# Patient Record
Sex: Female | Born: 1975 | Hispanic: No | Smoking: Never smoker
Health system: Southern US, Community
[De-identification: ages and names within clinical notes are randomized; demographics above are authoritative.]

## PROBLEM LIST (undated history)

## (undated) DIAGNOSIS — T7840XA Allergy, unspecified, initial encounter: Secondary | ICD-10-CM

## (undated) DIAGNOSIS — G43109 Migraine with aura, not intractable, without status migrainosus: Secondary | ICD-10-CM

## (undated) HISTORY — DX: Migraine with aura, not intractable, without status migrainosus: G43.109

## (undated) HISTORY — DX: Allergy, unspecified, initial encounter: T78.40XA

---

## 2007-12-19 ENCOUNTER — Encounter (INDEPENDENT_AMBULATORY_CARE_PROVIDER_SITE_OTHER): Payer: Self-pay | Admitting: Obstetrics and Gynecology

## 2007-12-19 ENCOUNTER — Ambulatory Visit (HOSPITAL_COMMUNITY): Admission: RE | Admit: 2007-12-19 | Discharge: 2007-12-19 | Payer: Self-pay | Admitting: Obstetrics and Gynecology

## 2008-11-22 ENCOUNTER — Inpatient Hospital Stay (HOSPITAL_COMMUNITY): Admission: AD | Admit: 2008-11-22 | Discharge: 2008-11-25 | Payer: Self-pay | Admitting: Obstetrics and Gynecology

## 2010-08-29 ENCOUNTER — Inpatient Hospital Stay (HOSPITAL_COMMUNITY): Admission: RE | Admit: 2010-08-29 | Discharge: 2010-09-01 | Payer: Self-pay | Admitting: Obstetrics and Gynecology

## 2011-01-06 LAB — CBC
HCT: 29.3 % — ABNORMAL LOW (ref 36.0–46.0)
HCT: 36.8 % (ref 36.0–46.0)
Hemoglobin: 10 g/dL — ABNORMAL LOW (ref 12.0–15.0)
MCH: 28.6 pg (ref 26.0–34.0)
MCV: 86.1 fL (ref 78.0–100.0)
MCV: 86.1 fL (ref 78.0–100.0)
Platelets: 249 10*3/uL (ref 150–400)
RBC: 4.27 MIL/uL (ref 3.87–5.11)
RDW: 13.4 % (ref 11.5–15.5)
RDW: 13.5 % (ref 11.5–15.5)
WBC: 11.3 10*3/uL — ABNORMAL HIGH (ref 4.0–10.5)
WBC: 9.4 10*3/uL (ref 4.0–10.5)

## 2011-01-06 LAB — SURGICAL PCR SCREEN: MRSA, PCR: NEGATIVE

## 2011-02-09 LAB — CBC
HCT: 30.2 % — ABNORMAL LOW (ref 36.0–46.0)
Hemoglobin: 11.6 g/dL — ABNORMAL LOW (ref 12.0–15.0)
MCHC: 33.4 g/dL (ref 30.0–36.0)
MCHC: 33.4 g/dL (ref 30.0–36.0)
MCV: 88.7 fL (ref 78.0–100.0)
MCV: 88.9 fL (ref 78.0–100.0)
Platelets: 199 10*3/uL (ref 150–400)
RBC: 3.92 MIL/uL (ref 3.87–5.11)
RDW: 13.4 % (ref 11.5–15.5)
RDW: 13.7 % (ref 11.5–15.5)

## 2011-02-09 LAB — RH IMMUNE GLOB WKUP(>/=20WKS)(NOT WOMEN'S HOSP)

## 2011-03-10 NOTE — Op Note (Signed)
NAMEERION, HERMANS NO.:  [id number]   MEDICAL RECORD NO.:  [id number]          PATIENT TYPE:  INP   LOCATION:  9148                          FACILITY:  [facility]   PHYSICIAN:  Lenoard Aden, M.D.DATE OF BIRTH:  Dec 08, 1975   DATE OF PROCEDURE:  11/22/2008  DATE OF DISCHARGE:                               OPERATIVE REPORT   PREOPERATIVE DIAGNOSES:  Postdates intrauterine pregnancy, nonreassuring  fetal heart rate tracing, active phase arrest, presumed large for  gestational age.   POSTOPERATIVE DIAGNOSES:  Postdates intrauterine pregnancy,  nonreassuring fetal heart rate tracing, active phase arrest, presumed  large for gestational age.   PROCEDURE:  Primary low segment transverse cesarean section.   SURGEON:  Lenoard Aden, MD   ANESTHESIA:  Epidural by Quillian Quince, MD   ESTIMATED BLOOD LOSS:  1000 mL.   COMPLICATIONS:  None.   DRAINS:  Foley.   COUNTS:  Correct.   The patient recovered in good condition.   FINDINGS:  Full-term living female, Apgars of 8 and 9, cord pH 7.27, 9  pounds 9 ounces, posterior placenta.  Normal tubes.  Normal ovaries.  Two-layer uterine closure.   BRIEF OPERATIVE NOTE:  After being apprised of risks of anesthesia,  infection, bleeding, injury to abdominal organs, need for repair, the  labor's immediate complications to include bowel and bladder injury, the  patient was brought to the operating where she was administered a dosing  of epidural anesthetic without complications, prepped and draped in the  usual sterile fashion.  Foley catheter was placed.  After achieving  adequate anesthesia, dilute Marcaine solution was placed.  Pfannenstiel  skin incision was made with the scalpel, carried down to the fascia  which was nicked in the midline and opened transversely using Mayo  scissors.  Rectus muscles dissected sharply in the midline.  Peritoneum  entered sharply.  Bladder blade was placed.  Visceral peritoneum  scored  sharply off the lower uterine segment.  Kerr hysterotomy incision was  made.  Atraumatic delivery of full-term living female as noted.  Apgars as  noted.  Placenta delivered manually intact.  Bulb suctioning done.  The  uterus exteriorized, curetted using a dry lap pack and closed in 2  running imbricating layers of 0 Monocryl suture.  Good hemostasis was  noted.  Bladder flap inspected and found to be hemostatic.  Irrigation  accomplished.  Peritoneum reapproximated using a 2-0 chromic in a  continuous running fashion.  Fascia closed using a 0 Monocryl in a  continuous running fashion.  Skin closed using staples.  The patient  tolerated the procedure well and transferred to recovery room in good  condition.      Lenoard Aden, M.D.  Electronically Signed     RJT/MEDQ  D:  11/23/2008  T:  11/24/2008  Job:  [id number]

## 2011-03-10 NOTE — Op Note (Signed)
NAMETENLEIGH, BYER    ACCOUNT NO.:  [id number]   MEDICAL RECORD NO.:  [id number]          PATIENT TYPE:  AMB   LOCATION:  SDC                           FACILITY:  [facility]   PHYSICIAN:  Guy Sandifer. Henderson Cloud, M.D. DATE OF BIRTH:  [birth year]   DATE OF PROCEDURE:  12/19/2007  DATE OF DISCHARGE:                               OPERATIVE REPORT   PREOPERATIVE DIAGNOSIS:  Spontaneous abortion.   POSTOPERATIVE DIAGNOSIS:  Spontaneous abortion.   PROCEDURE:  Dilatation and evacuation.   SURGEON:  Harold Hedge, MD   ANESTHESIA:  MAC.   SPECIMENS:  Products of conception to Pathology.   ESTIMATED BLOOD LOSS:  Minimal.   INDICATIONS AND CONSENT:  This patient is a 35 year old married female  G1, P0, who had viable pregnancy at approximately 9 weeks' estimated  gestational age.  She returns at approximately 14 weeks' estimated  gestational age with complaints of spotting.  Ultrasound reveals a 9-  week-size crown-rump length and no fetal heartbeat.  After options of  management are reviewed, the patient requests dilatation and evacuation.  Potential risks and complications are discussed preoperatively  including, but limited to infection, uterine perforation, organ damage,  bleeding and transfusion of blood.  All questions are answered and  consent is signed and on the chart.  I also discussed with the patient  possibly sending tissue for karyotype; the patient declined this at the  present time.   PROCEDURE:  The patient is taken to operating room where she is  identified, placed in dorsal supine position and given sedation.  She is  placed in the dorsal lithotomy position, where she is prepped, bladder  straight-catheterized and draped in a sterile fashion.  Uterus is 9-10  weeks in size.  A bivalve speculum is placed in the vagina and the  anterior cervical lip is injected with 1% Xylocaine plain and grasped  with a single-tooth tenaculum.  Paracervical block is placed at 2, 4,  5,  7, 8 and 10 o'clock positions with approximately 20 mL total plain of 1%  Xylocaine.  Cervix then is dilated to a 29 dilator.  A #9 curved suction  curette is placed in the uterine cavity and suction curettage is carried  out for obvious products of conception.  Twenty units of Pitocin are  added to a new liter of IV fluids; the patient also received intravenous  antibiotics.  Sharp curettage is carried out.  The uterine cavity is  also  explored gently with ring clamps.  After the cavity feels clean, good  hemostasis is noted.  The apparent placental tissue as well as a small  soft cranium is identified.  The cavity feels clean.  Good hemostasis is  noted.  Instruments are removed.  All counts are correct.  The patient  is awakened and taken to the recovery room in stable condition.      Guy Sandifer Henderson Cloud, M.D.  Electronically Signed     JET/MEDQ  D:  12/19/2007  T:  12/20/2007  Job:  [id number]

## 2011-03-10 NOTE — H&P (Signed)
Tanya Mccoy, Tanya Mccoy NO.:  [id number]   MEDICAL RECORD NO.:  [id number]          PATIENT TYPE:  INP   LOCATION:  9176                          FACILITY:  [facility]   PHYSICIAN:  Lenoard Aden, M.D.DATE OF BIRTH:  [birth year]   DATE OF ADMISSION:  11/22/2008  DATE OF DISCHARGE:                              HISTORY & PHYSICAL   CHIEF COMPLAINT:  Postdate.   This is a 35 year old Hispanic female, G2, P0, at 18 weeks' gestation  who presents for cervical ripening and induction.   She has no known drug allergies.   Medications are prenatal vitamins.   She is a nonsmoker, nondrinker.  She denies domestic or physical  violence.   History of one SAB in 2009.  History of wisdom teeth removal.  History  of D&C.   Family history of diabetes and stroke.   Prenatal course uncomplicated.   PHYSICAL EXAMINATION:  GENERAL:  She is a well-developed, well-nourished  female in no acute distress.  HEENT:  Normal.  LUNGS:  Clear.  HEART:  Regular rate and rhythm.  ABDOMEN:  Soft, gravid, nontender.  Estimated fetal weight 8-1/2 pounds.  Cervix is closed, 50%, vertex, and -2.  EXTREMITIES:  There are no cords.  NEUROLOGIC:  Nonfocal.  SKIN:  Intact.   IMPRESSION:  41 weeks OB, postdate for cervical ripening.   PLAN:  Cervidil was placed.  NST reactive.  Pitocin in a.m.  Anticipate  cautious attempts at vaginal delivery due to borderline telemetry.  The  patient and husband discussed increased risk of C-section.      Lenoard Aden, M.D.  Electronically Signed     RJT/MEDQ  D:  11/22/2008  T:  11/23/2008  Job:  [id number]

## 2011-03-13 NOTE — Discharge Summary (Signed)
Tanya Mccoy, TWIFORD NO.:  [id number]   MEDICAL RECORD NO.:  [id number]          PATIENT TYPE:  INP   LOCATION:  9148                          [facility]ACILITY:  [facility]   PHYSICIAN:  Lenoard Aden, M.D.DATE OF BIRTH:  [birth year]   DATE OF ADMISSION:  11/22/2008  DATE OF DISCHARGE:  11/25/2008                               DISCHARGE SUMMARY   The patient underwent uncomplicated primary C-section for active phase  arrest on November 23, 2008.   Postoperative course uncomplicated.  Tolerated diet well.  Discharge  teaching done.   DISCHARGE MEDICATIONS:  Tylox, prenatal vitamins, and iron.   Follow up in the office in 4-6 weeks.      Lenoard Aden, M.D.  Electronically Signed     RJT/MEDQ  D:  12/18/2008  T:  12/19/2008  Job:  [id number]

## 2011-07-20 LAB — CBC
HCT: 41.5
Hemoglobin: 14.4
MCHC: 34.6
MCV: 87.3
RBC: 4.76
WBC: 8

## 2011-07-20 LAB — RH IMMUNE GLOB WKUP(>/=20WKS)(NOT WOMEN'S HOSP)

## 2013-05-12 ENCOUNTER — Other Ambulatory Visit: Payer: Self-pay | Admitting: Physician Assistant

## 2013-09-19 ENCOUNTER — Encounter (INDEPENDENT_AMBULATORY_CARE_PROVIDER_SITE_OTHER): Payer: Self-pay

## 2013-09-19 ENCOUNTER — Ambulatory Visit (INDEPENDENT_AMBULATORY_CARE_PROVIDER_SITE_OTHER): Payer: 59 | Admitting: Family Medicine

## 2013-09-19 VITALS — BP 107/67 | HR 77 | Temp 98.6°F | Ht 63.0 in | Wt 161.6 lb

## 2013-09-19 DIAGNOSIS — J029 Acute pharyngitis, unspecified: Secondary | ICD-10-CM

## 2013-09-19 DIAGNOSIS — J329 Chronic sinusitis, unspecified: Secondary | ICD-10-CM

## 2013-09-19 LAB — POCT RAPID STREP A (OFFICE): Rapid Strep A Screen: NEGATIVE

## 2013-09-19 MED ORDER — AMOXICILLIN 500 MG PO CAPS: 500.0000 mg | ORAL_CAPSULE | Freq: Three times a day (TID) | ORAL | Status: DC

## 2013-09-19 MED ORDER — AZELASTINE HCL 0.1 % NA SOLN: NASAL | Status: DC

## 2013-09-19 NOTE — Patient Instructions (Signed)
Drink plenty of fluids Use a cool mist humidifier in her bedroom at nighttime Take medication as directed and use nose sprays as directed If you need anything for cough, use Mucinex maximum strength, blue and white in color, over-the-counter one twice daily with a large glass of water

## 2013-09-19 NOTE — Progress Notes (Signed)
Subjective:    Patient ID: Tanya Mccoy, female    DOB: 27-Mar-1976, 37 y.o.   MRN: [id number]  HPI Patient here today for sore throat, cough and congestion. The patient feels like most of the congestion is coming from her head and the drainage is mostly colored green. She has had some ear pain but denies fever.     There are no active problems to display for this patient.  Outpatient Encounter Prescriptions as of 09/19/2013  Medication Sig  . Fish Oil-Cholecalciferol (FISH OIL + D3) 1000-1000 MG-UNIT CAPS Take by mouth.  . Multiple Vitamin (MULTIVITAMIN WITH MINERALS) TABS tablet Take 1 tablet by mouth daily.    Review of Systems  Constitutional: Negative.  Negative for fever.  HENT: Positive for congestion and sore throat. Negative for postnasal drip.   Eyes: Negative.   Respiratory: Positive for cough (started saturday).   Cardiovascular: Negative.   Gastrointestinal: Negative.   Endocrine: Negative.   Genitourinary: Negative.   Musculoskeletal: Negative.   Skin: Negative.   Allergic/Immunologic: Negative.   Neurological: Positive for dizziness (little bit of dizziness) and headaches.  Hematological: Negative.   Psychiatric/Behavioral: Negative.        Objective:   Physical Exam  Nursing note and vitals reviewed. Constitutional: She appears well-developed and well-nourished. No distress.  HENT:  Head: Normocephalic and atraumatic.  Right Ear: External ear normal.  Left Ear: External ear normal.  Mouth/Throat: Oropharynx is clear and moist. No oropharyngeal exudate.  Nasal congestion bilaterally and a slightly red throat  Eyes: Conjunctivae and EOM are normal. Pupils are equal, round, and reactive to light. Right eye exhibits no discharge. Left eye exhibits no discharge. No scleral icterus.  Neck: Normal range of motion. Neck supple.  Cardiovascular: Normal rate and regular rhythm.   No murmur heard. Pulmonary/Chest: Effort normal. No respiratory  distress. She has no wheezes. She has no rales.  Dry cough  Musculoskeletal: Normal range of motion.  Lymphadenopathy:    She has no cervical adenopathy.  Neurological: She is alert.  Skin: Skin is warm and dry.  Psychiatric: She has a normal mood and affect. Her behavior is normal. Thought content normal.   BP 107/67  Pulse 77  Temp(Src) 98.6 F (37 C) (Oral)  Ht 5\' 3"  (1.6 m)  Wt 161 lb 9.6 oz (73.301 kg)  BMI 28.63 kg/m2  LMP 09/05/2013  Results for orders placed in visit on 09/19/13  POCT RAPID STREP A (OFFICE)      Result Value Range   Rapid Strep A Screen Negative  Negative          Assessment & Plan:   1. Sore throat   2. Rhinosinusitis    Orders Placed This Encounter  Procedures  . Strep A culture, throat  . POCT rapid strep A   Meds ordered this encounter  Medications  . Multiple Vitamin (MULTIVITAMIN WITH MINERALS) TABS tablet    Sig: Take 1 tablet by mouth daily.  . Fish Oil-Cholecalciferol (FISH OIL + D3) 1000-1000 MG-UNIT CAPS    Sig: Take by mouth.  Marland Kitchen amoxicillin (AMOXIL) 500 MG capsule    Sig: Take 1 capsule (500 mg total) by mouth 3 (three) times daily.    Dispense:  30 capsule    Refill:  0  . azelastine (ASTELIN) 137 MCG/SPRAY nasal spray    Sig: Use in each nostril as directed, one to 2 sprays nightly    Dispense:  30 mL    Refill:  12  Patient Instructions  Drink plenty of fluids Use a cool mist humidifier in her bedroom at nighttime Take medication as directed and use nose sprays as directed If you need anything for cough, use Mucinex maximum strength, blue and white in color, over-the-counter one twice daily with a large glass of water   Nyra Capes MD

## 2013-09-21 LAB — STREP A CULTURE, THROAT: Strep A Culture: NEGATIVE

## 2013-09-26 ENCOUNTER — Telehealth: Payer: Self-pay | Admitting: Family Medicine

## 2013-09-26 NOTE — Telephone Encounter (Signed)
Message copied by Azalee Course on Tue Sep 26, 2013  4:36 PM ------      Message from: Ernestina Penna      Created: Fri Sep 22, 2013  9:25 AM       The strep culture is negative, if taking antibiotic please complete the antibiotic ------

## 2013-09-26 NOTE — Telephone Encounter (Signed)
Cant get in touch with patient all numbers wrong

## 2013-12-14 ENCOUNTER — Encounter: Payer: Self-pay | Admitting: *Deleted

## 2013-12-14 ENCOUNTER — Ambulatory Visit (INDEPENDENT_AMBULATORY_CARE_PROVIDER_SITE_OTHER): Payer: 59 | Admitting: Family Medicine

## 2013-12-14 VITALS — BP 107/65 | HR 70 | Temp 97.0°F | Ht 63.0 in | Wt 167.0 lb

## 2013-12-14 DIAGNOSIS — R509 Fever, unspecified: Secondary | ICD-10-CM

## 2013-12-14 DIAGNOSIS — R52 Pain, unspecified: Secondary | ICD-10-CM

## 2013-12-14 DIAGNOSIS — R059 Cough, unspecified: Secondary | ICD-10-CM

## 2013-12-14 DIAGNOSIS — R6889 Other general symptoms and signs: Secondary | ICD-10-CM

## 2013-12-14 DIAGNOSIS — J029 Acute pharyngitis, unspecified: Secondary | ICD-10-CM

## 2013-12-14 DIAGNOSIS — R05 Cough: Secondary | ICD-10-CM

## 2013-12-14 LAB — POCT INFLUENZA A/B
INFLUENZA A, POC: NEGATIVE
INFLUENZA B, POC: NEGATIVE

## 2013-12-14 LAB — POCT RAPID STREP A (OFFICE): RAPID STREP A SCREEN: NEGATIVE

## 2013-12-14 MED ORDER — OSELTAMIVIR PHOSPHATE 75 MG PO CAPS: 75.0000 mg | ORAL_CAPSULE | Freq: Two times a day (BID) | ORAL | Status: DC

## 2013-12-14 NOTE — Patient Instructions (Signed)
Take Tylenol or Advil for aches pains and fever Drink plenty of fluids Take Mucinex maximum strength, blue and white in color, over-the-counter, one twice daily with a large glass of water for cough and congestion Use saline nose spray frequently Use a cool mist humidifier in her bedroom at nighttime Take medication as directed

## 2013-12-14 NOTE — Progress Notes (Signed)
Subjective:    Patient ID: Tanya Mccoy, female    DOB: 18-Dec-1975, 38 y.o.   MRN: [id number]  HPI Patient here today for flu-like symptoms x 3 days. The patient has had headache, congestion, weakness, and fever with sore throat or 3 days. The patient has 2 children and they had some type of intestinal bug a couple weeks ago. They have not had any other illnesses in the family. She did take the flu vaccine this year.     There are no active problems to display for this patient.  Outpatient Encounter Prescriptions as of 12/14/2013  Medication Sig  . Biotin 1000 MCG tablet Take 1,000 mcg by mouth daily.  . Fish Oil-Cholecalciferol (FISH OIL + D3) 1000-1000 MG-UNIT CAPS Take by mouth.  . Multiple Vitamin (MULTIVITAMIN WITH MINERALS) TABS tablet Take 1 tablet by mouth daily.  Marland Kitchen azelastine (ASTELIN) 137 MCG/SPRAY nasal spray Use in each nostril as directed, one to 2 sprays nightly  . [DISCONTINUED] amoxicillin (AMOXIL) 500 MG capsule Take 1 capsule (500 mg total) by mouth 3 (three) times daily.    Review of Systems  Constitutional: Positive for fever and fatigue. Negative for chills.  HENT: Positive for congestion, postnasal drip and sore throat.   Eyes: Negative.   Respiratory: Positive for cough.   Cardiovascular: Negative.   Gastrointestinal: Positive for nausea.  Endocrine: Negative.   Genitourinary: Negative.   Musculoskeletal: Positive for myalgias.  Skin: Negative.   Allergic/Immunologic: Negative.   Neurological: Positive for weakness and light-headedness.  Hematological: Negative.   Psychiatric/Behavioral: Negative.        Objective:   Physical Exam  Nursing note and vitals reviewed. Constitutional: She is oriented to person, place, and time. She appears well-developed and well-nourished. No distress.  HENT:  Head: Normocephalic and atraumatic.  Right Ear: External ear normal.  Left Ear: External ear normal.  Nose: Nose normal.  Throat was slightly  red right greater than left  Eyes: Conjunctivae and EOM are normal. Pupils are equal, round, and reactive to light. Right eye exhibits no discharge. Left eye exhibits no discharge. No scleral icterus.  Neck: Normal range of motion. Neck supple. No thyromegaly present.  Cardiovascular: Normal rate, regular rhythm, normal heart sounds and intact distal pulses.  Exam reveals no gallop and no friction rub.   No murmur heard. At 72 per minute  Pulmonary/Chest: Effort normal and breath sounds normal. No respiratory distress. She has no wheezes. She has no rales. She exhibits no tenderness.  Dry cough  Abdominal: Soft. Bowel sounds are normal. She exhibits no mass. There is no tenderness. There is no rebound and no guarding.  Musculoskeletal: Normal range of motion.  Lymphadenopathy:    She has no cervical adenopathy.  Neurological: She is alert and oriented to person, place, and time.  Skin: Skin is warm and dry. No rash noted.  Psychiatric: She has a normal mood and affect. Her behavior is normal. Judgment and thought content normal.   BP 107/65  Pulse 70  Temp(Src) 97 F (36.1 C) (Oral)  Ht 5' 3"  (1.6 m)  Wt 167 lb (75.751 kg)  BMI 29.59 kg/m2  LMP 11/29/2013  Results for orders placed in visit on 12/14/13  POCT INFLUENZA A/B      Result Value Ref Range   Influenza A, POC Negative     Influenza B, POC Negative    POCT RAPID STREP A (OFFICE)      Result Value Ref Range   Rapid Strep A  Screen Negative  Negative    Assessment-  1. Sore throat- POCT Influenza A/B - POCT rapid strep A - Strep A culture, throat  2. Fever - POCT Influenza A/B - POCT rapid strep A - oseltamivir (TAMIFLU) 75 MG capsule; Take 1 capsule (75 mg total) by mouth 2 (two) times daily.  Dispense: 10 capsule; Refill: 0  3. Body aches - POCT Influenza A/B - POCT rapid strep A  4. Cough - POCT Influenza A/B - POCT rapid strep A - oseltamivir (TAMIFLU) 75 MG capsule; Take 1 capsule (75 mg total) by mouth  2 (two) times daily.  Dispense: 10 capsule; Refill: 0  5. Flu-like symptoms - oseltamivir (TAMIFLU) 75 MG capsule; Take 1 capsule (75 mg total) by mouth 2 (two) times daily.  Dispense: 10 capsule; Refill: 0       Patient Instructions  Take Tylenol or Advil for aches pains and fever Drink plenty of fluids Take Mucinex maximum strength, blue and white in color, over-the-counter, one twice daily with a large glass of water for cough and congestion Use saline nose spray frequently Use a cool mist humidifier in her bedroom at nighttime Take medication as directed   Arrie Senate MD

## 2013-12-17 LAB — STREP A CULTURE, THROAT

## 2013-12-18 ENCOUNTER — Telehealth: Payer: Self-pay | Admitting: Family Medicine

## 2013-12-18 NOTE — Telephone Encounter (Signed)
Message copied by Waverly Ferrari on Mon Dec 18, 2013 11:03 AM ------      Message from: Chipper Herb      Created: Sun Dec 17, 2013  9:02 AM       Please call and check on this patient. Specifically see how her throat is doing. Since the culture grew out strep but not group A. we will still treat with penicillin. Call a prescription in for amoxicillin 500  3 times daily for 10 days ------

## 2015-05-27 ENCOUNTER — Encounter (INDEPENDENT_AMBULATORY_CARE_PROVIDER_SITE_OTHER): Payer: Self-pay

## 2015-05-27 ENCOUNTER — Encounter: Payer: Self-pay | Admitting: Physician Assistant

## 2015-05-27 ENCOUNTER — Ambulatory Visit (INDEPENDENT_AMBULATORY_CARE_PROVIDER_SITE_OTHER): Payer: 59 | Admitting: Physician Assistant

## 2015-05-27 VITALS — BP 131/74 | HR 72 | Temp 98.1°F | Ht 63.0 in | Wt 172.0 lb

## 2015-05-27 DIAGNOSIS — L309 Dermatitis, unspecified: Secondary | ICD-10-CM

## 2015-05-27 DIAGNOSIS — L089 Local infection of the skin and subcutaneous tissue, unspecified: Secondary | ICD-10-CM | POA: Diagnosis not present

## 2015-05-27 MED ORDER — HYDROCORTISONE 2.5 % EX CREA: TOPICAL_CREAM | Freq: Two times a day (BID) | CUTANEOUS | Status: DC

## 2015-05-27 MED ORDER — SULFAMETHOXAZOLE-TRIMETHOPRIM 800-160 MG PO TABS: 1.0000 | ORAL_TABLET | Freq: Two times a day (BID) | ORAL | Status: DC

## 2015-05-27 NOTE — Progress Notes (Signed)
   Subjective:    Patient ID: Tanya Mccoy, female    DOB: 30-Mar-1976, 39 y.o.   MRN: [id number]  HPI 39 y/o female presents with c/o navel redness and irritaiton after reinserting her navel ring. She had a piercing years ago but has not had a ring in until she reinserted it 1 week ago. 1 day later, she started developing itching and bumps around the area.     Review of Systems  Constitutional: Negative.   HENT: Negative.   Skin: Positive for color change.       Itching and redness in navel and around piercing        Objective:   Physical Exam  Constitutional: She appears well-developed and well-nourished. No distress.  Skin: She is not diaphoretic. There is erythema.  perinavicular erythematous base with papules  Psychiatric: She has a normal mood and affect. Her behavior is normal. Judgment and thought content normal.  Nursing note and vitals reviewed.         Assessment & Plan:  1. Skin infection  - sulfamethoxazole-trimethoprim (BACTRIM DS,SEPTRA DS) 800-160 MG per tablet; Take 1 tablet by mouth 2 (two) times daily.  Dispense: 20 tablet; Refill: 0 - Aerobic culture  2. Dermatitis - wash with gentle soap and water ( dove) No peroxide or alcohol. If culture is negative, reaction is likely d/t metal - hydrocortisone 2.5 % cream; Apply topically 2 (two) times daily.  Dispense: 30 g; Refill: 0   RTO prn   Kayen Grabel A. Benjamin Stain PA-C

## 2015-05-29 LAB — AEROBIC CULTURE

## 2015-05-31 ENCOUNTER — Telehealth: Payer: Self-pay | Admitting: Physician Assistant

## 2015-05-31 ENCOUNTER — Telehealth: Payer: Self-pay | Admitting: *Deleted

## 2015-05-31 NOTE — Telephone Encounter (Signed)
-----   Message from Adella Nissen, PA-C sent at 05/31/2015  9:31 AM EDT ----- Bacterial culture was negative. She may stop the antibiotic. This suggests that her irritation was d/t the actual navel ring instead of infection from re-piercing . She can apply otc hydrocortisone cream TID  if irritation remains. Tiffany A. Benjamin Stain PA-C

## 2015-07-15 ENCOUNTER — Ambulatory Visit (INDEPENDENT_AMBULATORY_CARE_PROVIDER_SITE_OTHER): Payer: 59 | Admitting: Physician Assistant

## 2015-07-15 ENCOUNTER — Encounter: Payer: Self-pay | Admitting: Physician Assistant

## 2015-07-15 VITALS — BP 118/77 | HR 89 | Temp 97.7°F | Ht 63.0 in | Wt 172.0 lb

## 2015-07-15 DIAGNOSIS — G43919 Migraine, unspecified, intractable, without status migrainosus: Secondary | ICD-10-CM | POA: Diagnosis not present

## 2015-07-15 MED ORDER — KETOROLAC TROMETHAMINE 60 MG/2ML IM SOLN
60.0000 mg | Freq: Once | INTRAMUSCULAR | Status: AC
  Administered 2015-07-15: 60 mg via INTRAMUSCULAR

## 2015-07-15 NOTE — Progress Notes (Signed)
   Subjective:    Patient ID: Tanya Mccoy, female    DOB: [birth year], 39 y.o.   MRN: [id number]  HPI 39 y/o female presents with c/o Headache since yesterday afternoon. She has tried Aleve, acetaminophen with no relief. Entire head hurts from neck up. Associated nausea, vomiting and sensitivity to light. She had migraines when she was younger but has not had one in several years. Denies trauma   Review of Systems  Constitutional: Positive for appetite change.  HENT: Positive for congestion.        1 week ago  Eyes: Positive for photophobia.  Respiratory: Negative.   Cardiovascular: Negative.   Gastrointestinal: Positive for nausea and vomiting.  Endocrine: Negative.   Genitourinary: Negative.   Musculoskeletal: Negative.   Skin: Negative.   Neurological: Positive for headaches.  Psychiatric/Behavioral: Negative.        Objective:   Physical Exam  Constitutional: She is oriented to person, place, and time. She appears well-developed and well-nourished.  HENT:  Head: Normocephalic and atraumatic.  Right Ear: External ear normal.  Left Ear: External ear normal.  Eyes: Conjunctivae and EOM are normal. Pupils are equal, round, and reactive to light. Right eye exhibits no discharge. Left eye exhibits no discharge.  Neck: Normal range of motion. Neck supple.  Musculoskeletal: She exhibits no edema or tenderness.  Neurological: She is alert and oriented to person, place, and time. She displays normal reflexes. No cranial nerve deficit. Coordination normal.  Psychiatric: She has a normal mood and affect. Her behavior is normal. Judgment and thought content normal.          Assessment & Plan:  1. Intractable migraine, unspecified migraine type -Discussed Imitrex if patient continues to have migraines - ketorolac (TORADOL) injection 60 mg; Inject 2 mLs (60 mg total) into the muscle once.   RTC prn   Tiffany A. Benjamin Stain PA-C

## 2015-07-15 NOTE — Patient Instructions (Signed)
Migraine Headache A migraine headache is an intense, throbbing pain on one or both sides of your head. A migraine can last for 30 minutes to several hours. CAUSES  The exact cause of a migraine headache is not always known. However, a migraine may be caused when nerves in the brain become irritated and release chemicals that cause inflammation. This causes pain. Certain things may also trigger migraines, such as:  Alcohol.  Smoking.  Stress.  Menstruation.  Aged cheeses.  Foods or drinks that contain nitrates, glutamate, aspartame, or tyramine.  Lack of sleep.  Chocolate.  Caffeine.  Hunger.  Physical exertion.  Fatigue.  Medicines used to treat chest pain (nitroglycerine), birth control pills, estrogen, and some blood pressure medicines. SIGNS AND SYMPTOMS  Pain on one or both sides of your head.  Pulsating or throbbing pain.  Severe pain that prevents daily activities.  Pain that is aggravated by any physical activity.  Nausea, vomiting, or both.  Dizziness.  Pain with exposure to bright lights, loud noises, or activity.  General sensitivity to bright lights, loud noises, or smells. Before you get a migraine, you may get warning signs that a migraine is coming (aura). An aura may include:  Seeing flashing lights.  Seeing bright spots, halos, or zigzag lines.  Having tunnel vision or blurred vision.  Having feelings of numbness or tingling.  Having trouble talking.  Having muscle weakness. DIAGNOSIS  A migraine headache is often diagnosed based on:  Symptoms.  Physical exam.  A CT scan or MRI of your head. These imaging tests cannot diagnose migraines, but they can help rule out other causes of headaches. TREATMENT Medicines may be given for pain and nausea. Medicines can also be given to help prevent recurrent migraines.  HOME CARE INSTRUCTIONS  Only take over-the-counter or prescription medicines for pain or discomfort as directed by your  health care provider. The use of long-term narcotics is not recommended.  Lie down in a dark, quiet room when you have a migraine.  Keep a journal to find out what may trigger your migraine headaches. For example, write down:  What you eat and drink.  How much sleep you get.  Any change to your diet or medicines.  Limit alcohol consumption.  Quit smoking if you smoke.  Get 7-9 hours of sleep, or as recommended by your health care provider.  Limit stress.  Keep lights dim if bright lights bother you and make your migraines worse. SEEK IMMEDIATE MEDICAL CARE IF:   Your migraine becomes severe.  You have a fever.  You have a stiff neck.  You have vision loss.  You have muscular weakness or loss of muscle control.  You start losing your balance or have trouble walking.  You feel faint or pass out.  You have severe symptoms that are different from your first symptoms. MAKE SURE YOU:   Understand these instructions.  Will watch your condition.  Will get help right away if you are not doing well or get worse. Document Released: 10/12/2005 Document Revised: 02/26/2014 Document Reviewed: 06/19/2013 [facility] Patient Information 2015 [place], [place]. This information is not intended to replace advice given to you by your health care provider. Make sure you discuss any questions you have with your health care provider.

## 2016-02-04 ENCOUNTER — Ambulatory Visit (INDEPENDENT_AMBULATORY_CARE_PROVIDER_SITE_OTHER): Payer: 59 | Admitting: Nurse Practitioner

## 2016-02-04 ENCOUNTER — Encounter: Payer: Self-pay | Admitting: Nurse Practitioner

## 2016-02-04 VITALS — BP 114/70 | HR 80 | Temp 97.0°F | Ht 63.0 in | Wt 165.0 lb

## 2016-02-04 DIAGNOSIS — J309 Allergic rhinitis, unspecified: Secondary | ICD-10-CM

## 2016-02-04 DIAGNOSIS — F4310 Post-traumatic stress disorder, unspecified: Secondary | ICD-10-CM | POA: Diagnosis not present

## 2016-02-04 MED ORDER — ESCITALOPRAM OXALATE 10 MG PO TABS: 10.0000 mg | ORAL_TABLET | Freq: Every day | ORAL | Status: DC

## 2016-02-04 MED ORDER — CETIRIZINE HCL 10 MG PO TABS: 10.0000 mg | ORAL_TABLET | Freq: Every day | ORAL | Status: DC

## 2016-02-04 MED ORDER — FLUTICASONE PROPIONATE 50 MCG/ACT NA SUSP: 2.0000 | Freq: Every day | NASAL | Status: DC

## 2016-02-04 NOTE — Progress Notes (Signed)
   Subjective:    Patient ID: Tanya Mccoy, female    DOB: 1976-08-11, 40 y.o.   MRN: [id number]  HPI    Review of Systems     Objective:   Physical Exam        Assessment & Plan:   Subjective:     Tanya Mccoy is a 40 y.o. female who presents for evaluation and treatment of allergic symptoms. Symptoms include: clear rhinorrhea, itchy eyes, itchy nose and sneezing and are present in a seasonal pattern. Precipitants include: pollen. Treatment currently includes flonase OTC has helped. and is effective. The following portions of the patient's history were reviewed and updated as appropriate: allergies, current medications, past family history, past medical history, past social history, past surgical history and problem list.  * she is also c/o not sleeping well, decreased appetite- her husband passed away this past weekend and she is not handling well. Review of Systems Pertinent items are noted in HPI.    Objective:    BP 114/70 mmHg  Pulse 80  Temp(Src) 97 F (36.1 C) (Oral)  Ht 5' 3"  (1.6 m)  Wt 165 lb (74.844 kg)  BMI 29.24 kg/m2 General appearance: alert and cooperative Eyes: conjunctivae/corneas clear. PERRL, EOM's intact. Fundi benign. Ears: normal TM's and external ear canals both ears Nose: Nares normal. Septum midline. Mucosa normal. No drainage or sinus tenderness., clear discharge, mild congestion, turbinates pale Throat: lips, mucosa, and tongue normal; teeth and gums normal Neck: no adenopathy, no carotid bruit, no JVD, supple, symmetrical, trachea midline and thyroid not enlarged, symmetric, no tenderness/mass/nodules Lungs: clear to auscultation bilaterally Heart: regular rate and rhythm, S1, S2 normal, no murmur, click, rub or gallop  Psych: tearful, good eye contact, answers all questions appropriately Assessment:    Allergic rhinitis.   PTSD  Plan:     1. Allergic rhinitis, unspecified allergic rhinitis type  [J30.9] Avoid allergens - fluticasone (FLONASE) 50 MCG/ACT nasal spray; Place 2 sprays into both nostrils daily.  Dispense: 16 g; Refill: 6 - cetirizine (ZYRTEC) 10 MG tablet; Take 1 tablet (10 mg total) by mouth daily.  Dispense: 30 tablet; Refill: 11  2. PTSD (post-traumatic stress disorder) Continue grief counseling - escitalopram (LEXAPRO) 10 MG tablet; Take 1 tablet (10 mg total) by mouth daily.  Dispense: 30 tablet; Refill: [place], FNP

## 2016-02-04 NOTE — Patient Instructions (Signed)
Complicated Grieving Grief is a normal response to the death of someone close to you. Feelings of fear, anger, and guilt can affect almost everyone who loses a loved one. It is also common to have symptoms of depression while you are grieving. These include problems with sleep, loss of appetite, and lack of energy. They may last for weeks or months after a loss. Complicated grief is different from normal grief or depression. Normal grieving involves sadness and feelings of loss, but these feelings are not constant. Complicated grief is a constant and severe type of grief. It interferes with your ability to function normally. It may last for several months to a year or longer. Complicated grief may require treatment from a mental health care provider. CAUSES  It is not known why some people continue to struggle with grief and others do not. You may be at higher risk for complicated grief if:  The death of your loved one was sudden or unexpected.  The death of your loved one was due to a violent event.  Your loved one committed suicide.  Your loved one was a child or a young person.  You were very close to or dependent on the loved one.  You have a history of depression. SIGNS AND SYMPTOMS Signs and symptoms of complicated grief may include:  Feeling disbelief or numbness.  Being unable to enjoy good memories of your loved one.  Needing to avoid anything that reminds you of your loved one.  Being unable to stop thinking about the death.  Feeling intense anger or guilt.  Feeling alone and hopeless.  Feeling that your life is meaningless and empty.  Losing the desire to live. DIAGNOSIS Your health care provider may diagnose complicated grief if:  You have constant symptoms of grief for 6-12 months or longer.  Your symptoms are interfering with your ability to live your life. Your health care provider may want you to see a mental health care provider. Many symptoms of depression  are similar to the symptoms of complicated grief. It is important to be evaluated for complicated grief along with other mental health conditions. TREATMENT  Talk therapy with a mental health provider is the most common treatment for complicated grief. During therapy, you will learn healthy ways to cope with the loss of your loved one. In some cases, your mental health care provider may also recommend antidepressant medicines. HOME CARE INSTRUCTIONS  Take care of yourself.  Eat regular meals and maintain a healthy diet. Eat plenty of fruits, vegetables, and whole grains.  Try to get some exercise each day.  Keep regular hours for sleep. Try to get at least 8 hours of sleep each night.  Do not use drugs or alcohol to ease your symptoms.  Take medicines only as directed by your health care provider.  Spend time with friends and loved ones.  Consider joining a grief (bereavement) support group to help you deal with your loss.  Keep all follow-up visits as directed by your health care provider. This is important. SEEK MEDICAL CARE IF:  Your symptoms keep you from functioning normally.  Your symptoms do not get better with treatment. SEEK IMMEDIATE MEDICAL CARE IF:  You have serious thoughts of hurting yourself or someone else.  You have suicidal feelings.   This information is not intended to replace advice given to you by your health care provider. Make sure you discuss any questions you have with your health care provider.   Document Released: 10/12/2005  Document Revised: 07/03/2015 Document Reviewed: 03/22/2014 Elsevier Interactive Patient Education [employer].

## 2016-03-04 ENCOUNTER — Telehealth: Payer: Self-pay | Admitting: Family Medicine

## 2016-03-04 NOTE — Telephone Encounter (Signed)
Yes, up front, pt aware

## 2016-04-29 ENCOUNTER — Ambulatory Visit (INDEPENDENT_AMBULATORY_CARE_PROVIDER_SITE_OTHER): Payer: BLUE CROSS/BLUE SHIELD | Admitting: Physician Assistant

## 2016-04-29 ENCOUNTER — Encounter: Payer: Self-pay | Admitting: Physician Assistant

## 2016-04-29 VITALS — BP 101/64 | HR 78 | Temp 97.5°F | Ht 63.0 in | Wt 170.6 lb

## 2016-04-29 DIAGNOSIS — H109 Unspecified conjunctivitis: Secondary | ICD-10-CM | POA: Diagnosis not present

## 2016-04-29 MED ORDER — TOBRAMYCIN 0.3 % OP SOLN: 2.0000 [drp] | Freq: Four times a day (QID) | OPHTHALMIC | Status: DC

## 2016-04-29 NOTE — Progress Notes (Signed)
Subjective:     Patient ID: Tanya Mccoy, female   DOB: 09/19/1976, 40 y.o.   MRN: [id number]  HPI Pt with bilat eye drainage and irritation over the last several days Now waking with eye crusted shut No change in vision  Review of Systems  Constitutional: Negative.   HENT: Positive for congestion, postnasal drip and sinus pressure. Negative for ear discharge, ear pain, nosebleeds, rhinorrhea, sneezing and sore throat.   Eyes: Positive for photophobia, discharge, redness and itching. Negative for pain and visual disturbance.       Objective:   Physical Exam  Constitutional: She appears well-developed and well-nourished.  HENT:  Right Ear: External ear normal.  Left Ear: External ear normal.  Mouth/Throat: Oropharynx is clear and moist. No oropharyngeal exudate.  Eyes: EOM are normal. Pupils are equal, round, and reactive to light. Right eye exhibits discharge. Left eye exhibits discharge.  + erythema bilat with matting to the lashes  Neck: Neck supple.  No preauric nodes palp  Lymphadenopathy:    She has no cervical adenopathy.  Nursing note and vitals reviewed.      Assessment:     1. Bilateral conjunctivitis        Plan:     Freq handwashing [employer] Opth eye gtts qid Transmission precautions reviewed Continue with Flonase and Zyrtec for others sx F/U prn

## 2016-04-29 NOTE — Patient Instructions (Signed)

## 2016-09-10 DIAGNOSIS — E559 Vitamin D deficiency, unspecified: Secondary | ICD-10-CM | POA: Diagnosis not present

## 2016-09-10 DIAGNOSIS — R635 Abnormal weight gain: Secondary | ICD-10-CM | POA: Diagnosis not present

## 2016-09-10 DIAGNOSIS — R5383 Other fatigue: Secondary | ICD-10-CM | POA: Diagnosis not present

## 2016-09-10 DIAGNOSIS — L659 Nonscarring hair loss, unspecified: Secondary | ICD-10-CM | POA: Diagnosis not present

## 2016-10-09 DIAGNOSIS — R5383 Other fatigue: Secondary | ICD-10-CM | POA: Diagnosis not present

## 2016-10-09 DIAGNOSIS — L659 Nonscarring hair loss, unspecified: Secondary | ICD-10-CM | POA: Diagnosis not present

## 2016-10-09 DIAGNOSIS — E559 Vitamin D deficiency, unspecified: Secondary | ICD-10-CM | POA: Diagnosis not present

## 2016-10-09 DIAGNOSIS — R635 Abnormal weight gain: Secondary | ICD-10-CM | POA: Diagnosis not present

## 2016-11-12 ENCOUNTER — Encounter: Payer: BLUE CROSS/BLUE SHIELD | Admitting: Pediatrics

## 2016-11-20 DIAGNOSIS — J020 Streptococcal pharyngitis: Secondary | ICD-10-CM | POA: Diagnosis not present

## 2016-11-25 ENCOUNTER — Encounter: Payer: BLUE CROSS/BLUE SHIELD | Admitting: Pediatrics

## 2016-12-04 ENCOUNTER — Ambulatory Visit (INDEPENDENT_AMBULATORY_CARE_PROVIDER_SITE_OTHER): Payer: BLUE CROSS/BLUE SHIELD | Admitting: Pediatrics

## 2016-12-04 ENCOUNTER — Encounter: Payer: Self-pay | Admitting: Pediatrics

## 2016-12-04 VITALS — BP 108/66 | HR 72 | Temp 97.5°F | Ht 63.0 in | Wt 178.0 lb

## 2016-12-04 DIAGNOSIS — Z124 Encounter for screening for malignant neoplasm of cervix: Secondary | ICD-10-CM

## 2016-12-04 DIAGNOSIS — Z0000 Encounter for general adult medical examination without abnormal findings: Secondary | ICD-10-CM | POA: Diagnosis not present

## 2016-12-04 NOTE — Progress Notes (Signed)
  Subjective:   Patient ID: Tanya Mccoy, female    DOB: May 22, 1976, 41 y.o.   MRN: [id number] CC: Annual Exam  HPI: Tanya Mccoy is a 41 y.o. 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 Allergies and medications reviewed and updated. History  Smoking Status  . Never Smoker  Smokeless Tobacco  . Never Used   ROS: All systems neg other than what is in HPI  Objective:    BP 108/66   Pulse 72   Temp 97.5 F (36.4 C) (Oral)   Ht 5' 3"  (1.6 m)   Wt 178 lb (80.7 kg)   BMI 31.53 kg/m   Wt Readings from Last 3 Encounters:  12/04/16 178 lb (80.7 kg)  04/29/16 170 lb 9.6 oz (77.4 kg)  02/04/16 165 lb (74.8 kg)    Gen: NAD, alert, cooperative with exam, NCAT EYES: EOMI, no conjunctival injection, or no icterus ENT:  R TM red, bulging, L TM red. OP without erythema LYMPH: no cervical LAD CV: NRRR, normal S1/S2, no murmur, distal pulses 2+ b/l Resp: CTABL, no wheezes, normal WOB Abd: +BS, soft, NTND. no guarding or organomegaly Ext: No edema, warm Neuro: Alert and oriented MSK: normal muscle bulk Breast: breasts with some varying density throughout, otherwise normal exam b/l GU: normal external exam, minimal fluid vaginal vault, normal appearing  cervix  Assessment & Plan:  Marceil was seen today for annual exam.  Diagnoses and all orders for this visit:  Encounter for preventive health examination Pap smear today Discussed when to start breast ca screening, no fam h/o breast ca, pt said she wants to start at 41yo Continue yearly exams -     Lipid panel -     CMP14+EGFR  Screening for cervical cancer -     Pap IG and HPV (high risk) DNA detection  Follow up plan: 1 yr for well exam Assunta Found, MD [place]

## 2016-12-05 LAB — CMP14+EGFR
ALBUMIN: 4.6 g/dL (ref 3.5–5.5)
ALT: 16 IU/L (ref 0–32)
AST: 18 IU/L (ref 0–40)
Albumin/Globulin Ratio: 1.6 (ref 1.2–2.2)
Alkaline Phosphatase: 78 IU/L (ref 39–117)
BUN / CREAT RATIO: 26 — AB (ref 9–23)
BUN: 18 mg/dL (ref 6–24)
Bilirubin Total: 0.2 mg/dL (ref 0.0–1.2)
CO2: 24 mmol/L (ref 18–29)
CREATININE: 0.7 mg/dL (ref 0.57–1.00)
Calcium: 9.5 mg/dL (ref 8.7–10.2)
Chloride: 99 mmol/L (ref 96–106)
GFR, EST AFRICAN AMERICAN: 124 mL/min/{1.73_m2} (ref >59)
GFR, EST NON AFRICAN AMERICAN: 108 mL/min/{1.73_m2} (ref >59)
GLOBULIN, TOTAL: 2.8 g/dL (ref 1.5–4.5)
GLUCOSE: 74 mg/dL (ref 65–99)
Potassium: 3.9 mmol/L (ref 3.5–5.2)
SODIUM: 139 mmol/L (ref 134–144)
TOTAL PROTEIN: 7.4 g/dL (ref 6.0–8.5)

## 2016-12-05 LAB — LIPID PANEL
CHOLESTEROL TOTAL: 209 mg/dL — AB (ref 100–199)
Chol/HDL Ratio: 2.8 ratio units (ref 0.0–4.4)
HDL: 74 mg/dL (ref >39)
LDL Calculated: 124 mg/dL — ABNORMAL HIGH (ref 0–99)
Triglycerides: 53 mg/dL (ref 0–149)
VLDL CHOLESTEROL CAL: 11 mg/dL (ref 5–40)

## 2016-12-08 LAB — PAP IG AND HPV HIGH-RISK
HPV, high-risk: NEGATIVE
PAP SMEAR COMMENT: 0

## 2016-12-10 ENCOUNTER — Other Ambulatory Visit: Payer: Self-pay | Admitting: Pediatrics

## 2016-12-10 DIAGNOSIS — Z1239 Encounter for other screening for malignant neoplasm of breast: Secondary | ICD-10-CM

## 2016-12-21 ENCOUNTER — Telehealth: Payer: Self-pay | Admitting: Family Medicine

## 2016-12-25 ENCOUNTER — Ambulatory Visit (HOSPITAL_COMMUNITY): Payer: Self-pay

## 2017-01-01 ENCOUNTER — Ambulatory Visit (HOSPITAL_COMMUNITY)
Admission: RE | Admit: 2017-01-01 | Discharge: 2017-01-01 | Disposition: A | Discharge status: Home / Self Care | Payer: BLUE CROSS/BLUE SHIELD | Source: Ambulatory Visit | Attending: Pediatrics | Admitting: Pediatrics

## 2017-01-01 DIAGNOSIS — Z1231 Encounter for screening mammogram for malignant neoplasm of breast: Secondary | ICD-10-CM | POA: Diagnosis not present

## 2017-01-01 DIAGNOSIS — Z1239 Encounter for other screening for malignant neoplasm of breast: Secondary | ICD-10-CM

## 2017-01-11 ENCOUNTER — Encounter: Payer: Self-pay | Admitting: Family Medicine

## 2017-01-11 ENCOUNTER — Ambulatory Visit (INDEPENDENT_AMBULATORY_CARE_PROVIDER_SITE_OTHER): Payer: BLUE CROSS/BLUE SHIELD | Admitting: Family Medicine

## 2017-01-11 VITALS — BP 112/64 | HR 81 | Temp 98.1°F | Ht 63.0 in | Wt 176.0 lb

## 2017-01-11 DIAGNOSIS — J0100 Acute maxillary sinusitis, unspecified: Secondary | ICD-10-CM

## 2017-01-11 DIAGNOSIS — J029 Acute pharyngitis, unspecified: Secondary | ICD-10-CM

## 2017-01-11 LAB — RAPID STREP SCREEN (MED CTR MEBANE ONLY): Strep Gp A Ag, IA W/Reflex: NEGATIVE

## 2017-01-11 LAB — CULTURE, GROUP A STREP

## 2017-01-11 MED ORDER — AMOXICILLIN-POT CLAVULANATE 875-125 MG PO TABS: 1.0000 | ORAL_TABLET | Freq: Two times a day (BID) | ORAL | 0 refills | Status: DC

## 2017-01-11 NOTE — Progress Notes (Signed)
Subjective:  Patient ID: Tanya Mccoy, female    DOB: Dec 20, 1975  Age: 41 y.o. MRN: [id number]  CC: Sore Throat (pt here today c/o sore throat, congestion and ear ache.)   HPI Tanya Mccoy presents for  Patient presents with dry cough runny stuffy nose. Diffuse headache of moderate intensity. Patient also has sore throat, posterior drainage and cough. No fever. Onset 3 days ago.   History Tanya Mccoy has no past medical history on file.   Tanya Mccoy has no past surgical history on file.   Tanya Mccoy family history includes Diabetes in Tanya Mccoy mother; Healthy in Tanya Mccoy father.Tanya Mccoy reports that Tanya Mccoy has never smoked. Tanya Mccoy has never used smokeless tobacco. Tanya Mccoy reports that Tanya Mccoy does not drink alcohol or use drugs.  Current Outpatient Prescriptions on File Prior to Visit  Medication Sig Dispense Refill  . Biotin 1000 MCG tablet Take 1,000 mcg by mouth daily.    . cetirizine (ZYRTEC) 10 MG tablet Take 1 tablet (10 mg total) by mouth daily. 30 tablet 11  . Multiple Vitamin (MULTIVITAMIN WITH MINERALS) TABS tablet Take 1 tablet by mouth daily.     No current facility-administered medications on file prior to visit.     ROS Review of Systems  Constitutional: Positive for appetite change. Negative for chills and fever.  HENT: Positive for congestion and rhinorrhea. Negative for ear pain, nosebleeds, postnasal drip, sinus pressure and sore throat.   Respiratory: Positive for cough. Negative for chest tightness and shortness of breath.   Cardiovascular: Negative for chest pain.  Musculoskeletal: Negative for myalgias.  Skin: Negative for rash.  Neurological: Positive for headaches.    Objective:  BP 112/64   Pulse 81   Temp 98.1 F (36.7 C) (Oral)   Ht 5' 3"  (1.6 m)   Wt 176 lb (79.8 kg)   BMI 31.18 kg/m   Physical Exam  Constitutional: Tanya Mccoy appears well-developed and well-nourished.  HENT:  Head: Normocephalic and atraumatic.  Right Ear: External ear normal. Tympanic  membrane is injected. No decreased hearing is noted.  Left Ear: Tympanic membrane and external ear normal. No decreased hearing is noted.  Nose: Mucosal edema present. Right sinus exhibits no frontal sinus tenderness. Left sinus exhibits no frontal sinus tenderness.  Mouth/Throat: No oropharyngeal exudate or posterior oropharyngeal erythema.  Neck: No Brudzinski's sign noted.  Pulmonary/Chest: Breath sounds normal. No respiratory distress.  Lymphadenopathy:       Head (right side): No preauricular adenopathy present.       Head (left side): No preauricular adenopathy present.       Right cervical: No superficial cervical adenopathy present.      Left cervical: No superficial cervical adenopathy present.    Assessment & Plan:   Tanya Mccoy was seen today for sore throat.  Diagnoses and all orders for this visit:  Sore throat -     Rapid strep screen (not at [facility])  Acute maxillary sinusitis, recurrence not specified  Other orders -     amoxicillin-clavulanate (AUGMENTIN) 875-125 MG tablet; Take 1 tablet by mouth 2 (two) times daily. Take all of this medication   I have discontinued Tanya Mccoy's fluticasone and tobramycin. I am also having Tanya Mccoy start on amoxicillin-clavulanate. Additionally, I am having Tanya Mccoy maintain Tanya Mccoy multivitamin with minerals, Biotin, and cetirizine.  Meds ordered this encounter  Medications  . amoxicillin-clavulanate (AUGMENTIN) 875-125 MG tablet    Sig: Take 1 tablet by mouth 2 (two) times daily. Take all of this medication    Dispense:  20 tablet  Refill:  0     Follow-up: Return if symptoms worsen or fail to improve.  Claretta Fraise, M.D.

## 2017-02-07 ENCOUNTER — Other Ambulatory Visit: Payer: Self-pay | Admitting: Nurse Practitioner

## 2017-02-07 DIAGNOSIS — J309 Allergic rhinitis, unspecified: Secondary | ICD-10-CM

## 2017-04-15 DIAGNOSIS — M25512 Pain in left shoulder: Secondary | ICD-10-CM | POA: Diagnosis not present

## 2017-04-21 ENCOUNTER — Encounter: Payer: BLUE CROSS/BLUE SHIELD | Admitting: *Deleted

## 2017-05-24 DIAGNOSIS — M62830 Muscle spasm of back: Secondary | ICD-10-CM | POA: Diagnosis not present

## 2017-05-24 DIAGNOSIS — M9903 Segmental and somatic dysfunction of lumbar region: Secondary | ICD-10-CM | POA: Diagnosis not present

## 2017-05-25 DIAGNOSIS — M9904 Segmental and somatic dysfunction of sacral region: Secondary | ICD-10-CM | POA: Diagnosis not present

## 2017-05-25 DIAGNOSIS — M9905 Segmental and somatic dysfunction of pelvic region: Secondary | ICD-10-CM | POA: Diagnosis not present

## 2017-05-25 DIAGNOSIS — M9903 Segmental and somatic dysfunction of lumbar region: Secondary | ICD-10-CM | POA: Diagnosis not present

## 2017-05-25 DIAGNOSIS — M62830 Muscle spasm of back: Secondary | ICD-10-CM | POA: Diagnosis not present

## 2017-05-26 DIAGNOSIS — M9904 Segmental and somatic dysfunction of sacral region: Secondary | ICD-10-CM | POA: Diagnosis not present

## 2017-05-26 DIAGNOSIS — M9905 Segmental and somatic dysfunction of pelvic region: Secondary | ICD-10-CM | POA: Diagnosis not present

## 2017-05-26 DIAGNOSIS — M9903 Segmental and somatic dysfunction of lumbar region: Secondary | ICD-10-CM | POA: Diagnosis not present

## 2017-05-26 DIAGNOSIS — M62830 Muscle spasm of back: Secondary | ICD-10-CM | POA: Diagnosis not present

## 2017-05-27 DIAGNOSIS — M9905 Segmental and somatic dysfunction of pelvic region: Secondary | ICD-10-CM | POA: Diagnosis not present

## 2017-05-27 DIAGNOSIS — M9903 Segmental and somatic dysfunction of lumbar region: Secondary | ICD-10-CM | POA: Diagnosis not present

## 2017-05-27 DIAGNOSIS — M62830 Muscle spasm of back: Secondary | ICD-10-CM | POA: Diagnosis not present

## 2017-05-27 DIAGNOSIS — M9904 Segmental and somatic dysfunction of sacral region: Secondary | ICD-10-CM | POA: Diagnosis not present

## 2017-05-31 DIAGNOSIS — M62830 Muscle spasm of back: Secondary | ICD-10-CM | POA: Diagnosis not present

## 2017-05-31 DIAGNOSIS — M9905 Segmental and somatic dysfunction of pelvic region: Secondary | ICD-10-CM | POA: Diagnosis not present

## 2017-05-31 DIAGNOSIS — M9904 Segmental and somatic dysfunction of sacral region: Secondary | ICD-10-CM | POA: Diagnosis not present

## 2017-05-31 DIAGNOSIS — M9903 Segmental and somatic dysfunction of lumbar region: Secondary | ICD-10-CM | POA: Diagnosis not present

## 2017-06-02 DIAGNOSIS — M9904 Segmental and somatic dysfunction of sacral region: Secondary | ICD-10-CM | POA: Diagnosis not present

## 2017-06-02 DIAGNOSIS — M9903 Segmental and somatic dysfunction of lumbar region: Secondary | ICD-10-CM | POA: Diagnosis not present

## 2017-06-02 DIAGNOSIS — M62830 Muscle spasm of back: Secondary | ICD-10-CM | POA: Diagnosis not present

## 2017-06-02 DIAGNOSIS — M9905 Segmental and somatic dysfunction of pelvic region: Secondary | ICD-10-CM | POA: Diagnosis not present

## 2017-06-03 DIAGNOSIS — M9904 Segmental and somatic dysfunction of sacral region: Secondary | ICD-10-CM | POA: Diagnosis not present

## 2017-06-03 DIAGNOSIS — M9905 Segmental and somatic dysfunction of pelvic region: Secondary | ICD-10-CM | POA: Diagnosis not present

## 2017-06-03 DIAGNOSIS — M62830 Muscle spasm of back: Secondary | ICD-10-CM | POA: Diagnosis not present

## 2017-06-03 DIAGNOSIS — M9903 Segmental and somatic dysfunction of lumbar region: Secondary | ICD-10-CM | POA: Diagnosis not present

## 2017-06-15 DIAGNOSIS — M9903 Segmental and somatic dysfunction of lumbar region: Secondary | ICD-10-CM | POA: Diagnosis not present

## 2017-06-15 DIAGNOSIS — M9905 Segmental and somatic dysfunction of pelvic region: Secondary | ICD-10-CM | POA: Diagnosis not present

## 2017-06-15 DIAGNOSIS — M9904 Segmental and somatic dysfunction of sacral region: Secondary | ICD-10-CM | POA: Diagnosis not present

## 2017-06-15 DIAGNOSIS — M62830 Muscle spasm of back: Secondary | ICD-10-CM | POA: Diagnosis not present

## 2017-06-16 DIAGNOSIS — M9903 Segmental and somatic dysfunction of lumbar region: Secondary | ICD-10-CM | POA: Diagnosis not present

## 2017-06-16 DIAGNOSIS — M62830 Muscle spasm of back: Secondary | ICD-10-CM | POA: Diagnosis not present

## 2017-06-16 DIAGNOSIS — M9905 Segmental and somatic dysfunction of pelvic region: Secondary | ICD-10-CM | POA: Diagnosis not present

## 2017-06-16 DIAGNOSIS — M9904 Segmental and somatic dysfunction of sacral region: Secondary | ICD-10-CM | POA: Diagnosis not present

## 2017-06-17 DIAGNOSIS — M9904 Segmental and somatic dysfunction of sacral region: Secondary | ICD-10-CM | POA: Diagnosis not present

## 2017-06-17 DIAGNOSIS — M62830 Muscle spasm of back: Secondary | ICD-10-CM | POA: Diagnosis not present

## 2017-06-17 DIAGNOSIS — M9905 Segmental and somatic dysfunction of pelvic region: Secondary | ICD-10-CM | POA: Diagnosis not present

## 2017-06-17 DIAGNOSIS — M9903 Segmental and somatic dysfunction of lumbar region: Secondary | ICD-10-CM | POA: Diagnosis not present

## 2017-06-21 DIAGNOSIS — M9903 Segmental and somatic dysfunction of lumbar region: Secondary | ICD-10-CM | POA: Diagnosis not present

## 2017-06-21 DIAGNOSIS — M9904 Segmental and somatic dysfunction of sacral region: Secondary | ICD-10-CM | POA: Diagnosis not present

## 2017-06-21 DIAGNOSIS — M62830 Muscle spasm of back: Secondary | ICD-10-CM | POA: Diagnosis not present

## 2017-06-21 DIAGNOSIS — M9905 Segmental and somatic dysfunction of pelvic region: Secondary | ICD-10-CM | POA: Diagnosis not present

## 2017-06-22 DIAGNOSIS — M25512 Pain in left shoulder: Secondary | ICD-10-CM | POA: Diagnosis not present

## 2017-06-29 DIAGNOSIS — M62830 Muscle spasm of back: Secondary | ICD-10-CM | POA: Diagnosis not present

## 2017-06-29 DIAGNOSIS — M9903 Segmental and somatic dysfunction of lumbar region: Secondary | ICD-10-CM | POA: Diagnosis not present

## 2017-06-29 DIAGNOSIS — M9904 Segmental and somatic dysfunction of sacral region: Secondary | ICD-10-CM | POA: Diagnosis not present

## 2017-06-29 DIAGNOSIS — M9905 Segmental and somatic dysfunction of pelvic region: Secondary | ICD-10-CM | POA: Diagnosis not present

## 2017-06-30 DIAGNOSIS — M25512 Pain in left shoulder: Secondary | ICD-10-CM | POA: Diagnosis not present

## 2017-07-05 DIAGNOSIS — M25512 Pain in left shoulder: Secondary | ICD-10-CM | POA: Diagnosis not present

## 2017-07-05 DIAGNOSIS — M62830 Muscle spasm of back: Secondary | ICD-10-CM | POA: Diagnosis not present

## 2017-07-05 DIAGNOSIS — M9903 Segmental and somatic dysfunction of lumbar region: Secondary | ICD-10-CM | POA: Diagnosis not present

## 2017-07-05 DIAGNOSIS — M9905 Segmental and somatic dysfunction of pelvic region: Secondary | ICD-10-CM | POA: Diagnosis not present

## 2017-07-05 DIAGNOSIS — M9904 Segmental and somatic dysfunction of sacral region: Secondary | ICD-10-CM | POA: Diagnosis not present

## 2017-07-12 DIAGNOSIS — M9904 Segmental and somatic dysfunction of sacral region: Secondary | ICD-10-CM | POA: Diagnosis not present

## 2017-07-12 DIAGNOSIS — M9903 Segmental and somatic dysfunction of lumbar region: Secondary | ICD-10-CM | POA: Diagnosis not present

## 2017-07-12 DIAGNOSIS — M9905 Segmental and somatic dysfunction of pelvic region: Secondary | ICD-10-CM | POA: Diagnosis not present

## 2017-07-12 DIAGNOSIS — M62830 Muscle spasm of back: Secondary | ICD-10-CM | POA: Diagnosis not present

## 2017-07-19 DIAGNOSIS — M9904 Segmental and somatic dysfunction of sacral region: Secondary | ICD-10-CM | POA: Diagnosis not present

## 2017-07-19 DIAGNOSIS — M9905 Segmental and somatic dysfunction of pelvic region: Secondary | ICD-10-CM | POA: Diagnosis not present

## 2017-07-19 DIAGNOSIS — M62830 Muscle spasm of back: Secondary | ICD-10-CM | POA: Diagnosis not present

## 2017-07-19 DIAGNOSIS — M9903 Segmental and somatic dysfunction of lumbar region: Secondary | ICD-10-CM | POA: Diagnosis not present

## 2017-07-26 DIAGNOSIS — M9905 Segmental and somatic dysfunction of pelvic region: Secondary | ICD-10-CM | POA: Diagnosis not present

## 2017-07-26 DIAGNOSIS — M9903 Segmental and somatic dysfunction of lumbar region: Secondary | ICD-10-CM | POA: Diagnosis not present

## 2017-07-26 DIAGNOSIS — M9904 Segmental and somatic dysfunction of sacral region: Secondary | ICD-10-CM | POA: Diagnosis not present

## 2017-07-26 DIAGNOSIS — M62830 Muscle spasm of back: Secondary | ICD-10-CM | POA: Diagnosis not present

## 2017-08-09 DIAGNOSIS — M9905 Segmental and somatic dysfunction of pelvic region: Secondary | ICD-10-CM | POA: Diagnosis not present

## 2017-08-09 DIAGNOSIS — M62830 Muscle spasm of back: Secondary | ICD-10-CM | POA: Diagnosis not present

## 2017-08-09 DIAGNOSIS — M9903 Segmental and somatic dysfunction of lumbar region: Secondary | ICD-10-CM | POA: Diagnosis not present

## 2017-08-09 DIAGNOSIS — M9904 Segmental and somatic dysfunction of sacral region: Secondary | ICD-10-CM | POA: Diagnosis not present

## 2017-09-14 ENCOUNTER — Encounter: Payer: Self-pay | Admitting: Family

## 2017-09-14 ENCOUNTER — Ambulatory Visit: Payer: BLUE CROSS/BLUE SHIELD | Admitting: Family

## 2017-09-14 VITALS — BP 107/64 | HR 88 | Temp 97.7°F | Ht 63.0 in | Wt 187.0 lb

## 2017-09-14 DIAGNOSIS — M9905 Segmental and somatic dysfunction of pelvic region: Secondary | ICD-10-CM | POA: Diagnosis not present

## 2017-09-14 DIAGNOSIS — M9903 Segmental and somatic dysfunction of lumbar region: Secondary | ICD-10-CM | POA: Diagnosis not present

## 2017-09-14 DIAGNOSIS — B9789 Other viral agents as the cause of diseases classified elsewhere: Secondary | ICD-10-CM | POA: Diagnosis not present

## 2017-09-14 DIAGNOSIS — M9904 Segmental and somatic dysfunction of sacral region: Secondary | ICD-10-CM | POA: Diagnosis not present

## 2017-09-14 DIAGNOSIS — J329 Chronic sinusitis, unspecified: Secondary | ICD-10-CM

## 2017-09-14 DIAGNOSIS — M62830 Muscle spasm of back: Secondary | ICD-10-CM | POA: Diagnosis not present

## 2017-09-14 MED ORDER — FLUTICASONE PROPIONATE 50 MCG/ACT NA SUSP: 2.0000 | Freq: Every day | NASAL | 6 refills | Status: DC

## 2017-09-14 MED ORDER — AMOXICILLIN-POT CLAVULANATE 875-125 MG PO TABS: 1.0000 | ORAL_TABLET | Freq: Two times a day (BID) | ORAL | 0 refills | Status: DC

## 2017-09-14 NOTE — Progress Notes (Signed)
   Subjective:    Patient ID: Tanya Mccoy, female    DOB: [birth year], 41 y.o.   MRN: [id number]  Sinusitis  This is a new problem. The current episode started in the past 7 days. The problem has been gradually worsening since onset. There has been no fever. Her pain is at a severity of 6/10. The pain is mild. Associated symptoms include congestion, ear pain ("a little"), sinus pressure and a sore throat. Pertinent negatives include no chills, coughing, headaches, hoarse voice, shortness of breath or sneezing. Past treatments include oral decongestants and acetaminophen. The treatment provided mild relief.      Review of Systems  Constitutional: Negative for chills.  HENT: Positive for congestion, ear pain ("a little"), sinus pressure and sore throat. Negative for hoarse voice and sneezing.   Respiratory: Negative for cough and shortness of breath.   Neurological: Negative for headaches.  All other systems reviewed and are negative.      Objective:   Physical Exam  Constitutional: She is oriented to person, place, and time. She appears well-developed and well-nourished. No distress.  HENT:  Head: Normocephalic and atraumatic.  Right Ear: External ear normal.  Left Ear: External ear normal.  Nose: Mucosal edema and rhinorrhea present. Right sinus exhibits maxillary sinus tenderness. Left sinus exhibits maxillary sinus tenderness.  Mouth/Throat: Oropharynx is clear and moist.  Eyes: Pupils are equal, round, and reactive to light.  Neck: Normal range of motion. Neck supple. No thyromegaly present.  Cardiovascular: Normal rate, regular rhythm, normal heart sounds and intact distal pulses.  No murmur heard. Pulmonary/Chest: Effort normal and breath sounds normal. No respiratory distress. She has no wheezes.  Abdominal: Soft. Bowel sounds are normal. She exhibits no distension. There is no tenderness.  Musculoskeletal: Normal range of motion. She exhibits no edema or  tenderness.  Neurological: She is alert and oriented to person, place, and time. She has normal reflexes. No cranial nerve deficit.  Skin: Skin is warm and dry.  Psychiatric: She has a normal mood and affect. Her behavior is normal. Judgment and thought content normal.  Vitals reviewed.    BP 107/64   Pulse 88   Temp 97.7 F (36.5 C) (Oral)   Ht 5' 3"  (1.6 m)   Wt 187 lb (84.8 kg)   BMI 33.13 kg/m      Assessment & Plan:  1. Viral sinusitis - Take meds as prescribed - Use a cool mist humidifier  -Use saline nose sprays frequently -Saline irrigations of the nose can be very helpful if done frequently. -Force fluids -For any cough or congestion  Use plain Mucinex- regular strength or max strength is fine -For fever or aces or pains- take tylenol or ibuprofen appropriate for age and weight. -Throat lozenges if help - fluticasone (FLONASE) 50 MCG/ACT nasal spray; Place 2 sprays into both nostrils daily.  Dispense: 16 g; Refill: 6   PT is leaving for [employer] and will be out of town for 5 days. I will send in Augment rx, but discussed for patient not to start this medication unless symptoms worsen or do not improve over the next 3-5 days.    Evelina Dun, FNP

## 2017-09-14 NOTE — Patient Instructions (Signed)

## 2017-10-12 DIAGNOSIS — M9903 Segmental and somatic dysfunction of lumbar region: Secondary | ICD-10-CM | POA: Diagnosis not present

## 2017-10-12 DIAGNOSIS — M62830 Muscle spasm of back: Secondary | ICD-10-CM | POA: Diagnosis not present

## 2017-10-12 DIAGNOSIS — M9905 Segmental and somatic dysfunction of pelvic region: Secondary | ICD-10-CM | POA: Diagnosis not present

## 2017-10-12 DIAGNOSIS — M9904 Segmental and somatic dysfunction of sacral region: Secondary | ICD-10-CM | POA: Diagnosis not present

## 2017-10-16 ENCOUNTER — Encounter: Payer: Self-pay | Admitting: Family Medicine

## 2017-10-16 ENCOUNTER — Ambulatory Visit: Payer: BLUE CROSS/BLUE SHIELD | Admitting: Family Medicine

## 2017-10-16 VITALS — BP 102/65 | HR 69 | Temp 97.2°F | Ht 63.0 in | Wt 190.4 lb

## 2017-10-16 DIAGNOSIS — J0100 Acute maxillary sinusitis, unspecified: Secondary | ICD-10-CM

## 2017-10-16 MED ORDER — AZITHROMYCIN 250 MG PO TABS: ORAL_TABLET | ORAL | 0 refills | Status: DC

## 2017-10-16 NOTE — Progress Notes (Signed)
BP 102/65   Pulse 69   Temp (!) 97.2 F (36.2 C) (Oral)   Ht 5' 3"  (1.6 m)   Wt 190 lb 6.4 oz (86.4 kg)   BMI 33.73 kg/m    Subjective:    Patient ID: Tanya Mccoy, female    DOB: 01/19/1976, 41 y.o.   MRN: [id number]  HPI: Tanya Mccoy is a 41 y.o. female presenting on 10/16/2017 for Sinusitis (for last week, no known fever, OTC mucinex, nyquil); Headache; and Sore Throat   HPI Sore throat and sinus pressure and headache Patient has had sore throat and sinus pressure and headache that is been going on for the last week.  She has not had any fever but she has had some chills.  She has been using over-the-counter Mucinex and NyQuil but they do not seem to be helping and it is not improving.  She typically gets this sinus infection once every few years and needs to be treated with some form of antibiotic.  She denies any sick contacts that she knows of.  She does feels like it is not improving despite using over-the-counter supplements and aids.  Relevant past medical, surgical, family and social history reviewed and updated as indicated. Interim medical history since our last visit reviewed. Allergies and medications reviewed and updated.  Review of Systems  Constitutional: Negative for chills and fever.  HENT: Positive for congestion, postnasal drip, rhinorrhea, sinus pressure, sneezing and sore throat. Negative for ear discharge and ear pain.   Eyes: Negative for pain, redness and visual disturbance.  Respiratory: Positive for cough. Negative for chest tightness, shortness of breath and wheezing.   Cardiovascular: Negative for chest pain and leg swelling.  Genitourinary: Negative for difficulty urinating and dysuria.  Musculoskeletal: Negative for back pain and gait problem.  Skin: Negative for rash.  Neurological: Negative for light-headedness and headaches.  Psychiatric/Behavioral: Negative for agitation and behavioral problems.  All other systems  reviewed and are negative.   Per HPI unless specifically indicated above        Objective:    BP 102/65   Pulse 69   Temp (!) 97.2 F (36.2 C) (Oral)   Ht 5' 3"  (1.6 m)   Wt 190 lb 6.4 oz (86.4 kg)   BMI 33.73 kg/m   Wt Readings from Last 3 Encounters:  10/16/17 190 lb 6.4 oz (86.4 kg)  09/14/17 187 lb (84.8 kg)  01/11/17 176 lb (79.8 kg)    Physical Exam  Constitutional: She is oriented to person, place, and time. She appears well-developed and well-nourished. No distress.  HENT:  Right Ear: Tympanic membrane, external ear and ear canal normal.  Left Ear: Tympanic membrane, external ear and ear canal normal.  Nose: Mucosal edema and rhinorrhea present. No epistaxis. Right sinus exhibits no maxillary sinus tenderness and no frontal sinus tenderness. Left sinus exhibits no maxillary sinus tenderness and no frontal sinus tenderness.  Mouth/Throat: Uvula is midline and mucous membranes are normal. Posterior oropharyngeal edema and posterior oropharyngeal erythema present. No oropharyngeal exudate or tonsillar abscesses.  Eyes: Conjunctivae and EOM are normal.  Neck: Neck supple. No thyromegaly present.  Cardiovascular: Normal rate, regular rhythm, normal heart sounds and intact distal pulses.  No murmur heard. Pulmonary/Chest: Effort normal and breath sounds normal. No respiratory distress. She has no wheezes. She has no rales.  Musculoskeletal: Normal range of motion. She exhibits no edema.  Lymphadenopathy:    She has no cervical adenopathy.  Neurological: She is alert and  oriented to person, place, and time. Coordination normal.  Skin: Skin is warm and dry. No rash noted. She is not diaphoretic.  Psychiatric: She has a normal mood and affect. Her behavior is normal.  Vitals reviewed.     Assessment & Plan:   Problem List Items Addressed This Visit    None    Visit Diagnoses    Acute non-recurrent maxillary sinusitis    -  Primary   Relevant Medications    azithromycin (ZITHROMAX) 250 MG tablet       Follow up plan: Return if symptoms worsen or fail to improve.  Counseling provided for all of the vaccine components No orders of the defined types were placed in this encounter.   Caryl Pina, MD [facility] 10/16/2017, 9:41 AM

## 2017-11-22 ENCOUNTER — Ambulatory Visit: Payer: BLUE CROSS/BLUE SHIELD | Admitting: Pediatrics

## 2017-11-22 ENCOUNTER — Encounter: Payer: Self-pay | Admitting: Pediatrics

## 2017-11-22 ENCOUNTER — Telehealth: Payer: Self-pay | Admitting: Pediatrics

## 2017-11-22 VITALS — BP 113/69 | HR 82 | Temp 97.5°F | Ht 63.0 in | Wt 192.8 lb

## 2017-11-22 DIAGNOSIS — L309 Dermatitis, unspecified: Secondary | ICD-10-CM

## 2017-11-22 MED ORDER — TRIAMCINOLONE ACETONIDE 0.1 % EX CREA: 1.0000 "application " | TOPICAL_CREAM | Freq: Two times a day (BID) | CUTANEOUS | 0 refills | Status: DC

## 2017-11-22 NOTE — Progress Notes (Signed)
  Subjective:   Patient ID: Tanya Mccoy, female    DOB: 04/27/1976, 42 y.o.   MRN: [id number] CC: Rash (on upper chest for about a week, itches, cortisone cream, no change in detergent/soap)  HPI: Tanya Mccoy is a 42 y.o. female presenting for Rash (on upper chest for about a week, itches, cortisone cream, no change in detergent/soap)  Started a week ago soon after picking up outside cat Doesn't usually have skin problems, sometimes dry patches on elbows  Is allergic to cats Rash hasnt been changing, growing, moving Thinks slightly puffier at first, more flat now No blisters on it No fevers, no systemic symptoms No other rash Cortisone helped with the itching  Relevant past medical, surgical, family and social history reviewed. Allergies and medications reviewed and updated. Social History   Tobacco Use  Smoking Status Never Smoker  Smokeless Tobacco Never Used   ROS: Per HPI   Objective:    BP 113/69   Pulse 82   Temp (!) 97.5 F (36.4 C) (Oral)   Ht 5' 3"  (1.6 m)   Wt 192 lb 12.8 oz (87.5 kg)   BMI 34.15 kg/m   Wt Readings from Last 3 Encounters:  11/22/17 192 lb 12.8 oz (87.5 kg)  10/16/17 190 lb 6.4 oz (86.4 kg)  09/14/17 187 lb (84.8 kg)    Gen: NAD, alert, cooperative with exam, NCAT EYES: EOMI, no conjunctival injection, or no icterus CV: NRRR, normal S1/S2, no murmur, distal pulses 2+ b/l Resp: CTABL, no wheezes, normal WOB Ext: No edema, warm Neuro: Alert and oriented Skin: linear patch of red in U shape on upper chest, well demarcated, no vesicles  Assessment & Plan:  Tanya Mccoy was seen today for rash.  Diagnoses and all orders for this visit:  Dermatitis -     triamcinolone cream (KENALOG) 0.1 %; Apply 1 application topically 2 (two) times daily.   Follow up plan: Return if symptoms worsen or fail to improve. Assunta Found, MD [place]

## 2017-11-22 NOTE — Telephone Encounter (Signed)
Pt aware sent to [facility]

## 2017-12-06 ENCOUNTER — Encounter: Payer: BLUE CROSS/BLUE SHIELD | Admitting: Family Medicine

## 2017-12-10 ENCOUNTER — Encounter: Payer: Self-pay | Admitting: Family Medicine

## 2017-12-10 ENCOUNTER — Ambulatory Visit (INDEPENDENT_AMBULATORY_CARE_PROVIDER_SITE_OTHER): Payer: BLUE CROSS/BLUE SHIELD | Admitting: Family Medicine

## 2017-12-10 VITALS — BP 111/69 | HR 83 | Temp 99.1°F | Ht 63.0 in | Wt 191.2 lb

## 2017-12-10 DIAGNOSIS — Z0000 Encounter for general adult medical examination without abnormal findings: Secondary | ICD-10-CM | POA: Diagnosis not present

## 2017-12-10 NOTE — Patient Instructions (Signed)
Great to see you!  Health Maintenance, Female Adopting a healthy lifestyle and getting preventive care can go a long way to promote health and wellness. Talk with your health care provider about what schedule of regular examinations is right for you. This is a good chance for you to check in with your provider about disease prevention and staying healthy. In between checkups, there are plenty of things you can do on your own. Experts have done a lot of research about which lifestyle changes and preventive measures are most likely to keep you healthy. Ask your health care provider for more information. Weight and diet Eat a healthy diet  Be sure to include plenty of vegetables, fruits, low-fat dairy products, and lean protein.  Do not eat a lot of foods high in solid fats, added sugars, or salt.  Get regular exercise. This is one of the most important things you can do for your health. ? Most adults should exercise for at least 150 minutes each week. The exercise should increase your heart rate and make you sweat (moderate-intensity exercise). ? Most adults should also do strengthening exercises at least twice a week. This is in addition to the moderate-intensity exercise.  Maintain a healthy weight  Body mass index (BMI) is a measurement that can be used to identify possible weight problems. It estimates body fat based on height and weight. Your health care provider can help determine your BMI and help you achieve or maintain a healthy weight.  For females 9 years of age and older: ? A BMI below 18.5 is considered underweight. ? A BMI of 18.5 to 24.9 is normal. ? A BMI of 25 to 29.9 is considered overweight. ? A BMI of 30 and above is considered obese.  Watch levels of cholesterol and blood lipids  You should start having your blood tested for lipids and cholesterol at 42 years of age, then have this test every 5 years.  You may need to have your cholesterol levels checked more often  if: ? Your lipid or cholesterol levels are high. ? You are older than 42 years of age. ? You are at high risk for heart disease.  Cancer screening Lung Cancer  Lung cancer screening is recommended for adults 63-83 years old who are at high risk for lung cancer because of a history of smoking.  A yearly low-dose CT scan of the lungs is recommended for people who: ? Currently smoke. ? Have quit within the past 15 years. ? Have at least a 30-pack-year history of smoking. A pack year is smoking an average of one pack of cigarettes a day for 1 year.  Yearly screening should continue until it has been 15 years since you quit.  Yearly screening should stop if you develop a health problem that would prevent you from having lung cancer treatment.  Breast Cancer  Practice breast self-awareness. This means understanding how your breasts normally appear and feel.  It also means doing regular breast self-exams. Let your health care provider know about any changes, no matter how small.  If you are in your 20s or 30s, you should have a clinical breast exam (CBE) by a health care provider every 1-3 years as part of a regular health exam.  If you are 36 or older, have a CBE every year. Also consider having a breast X-ray (mammogram) every year.  If you have a family history of breast cancer, talk to your health care provider about genetic screening.  If  you are at high risk for breast cancer, talk to your health care provider about having an MRI and a mammogram every year.  Breast cancer gene (BRCA) assessment is recommended for women who have family members with BRCA-related cancers. BRCA-related cancers include: ? Breast. ? Ovarian. ? Tubal. ? Peritoneal cancers.  Results of the assessment will determine the need for genetic counseling and BRCA1 and BRCA2 testing.  Cervical Cancer Your health care provider may recommend that you be screened regularly for cancer of the pelvic organs  (ovaries, uterus, and vagina). This screening involves a pelvic examination, including checking for microscopic changes to the surface of your cervix (Pap test). You may be encouraged to have this screening done every 3 years, beginning at age 21.  For women ages 30-65, health care providers may recommend pelvic exams and Pap testing every 3 years, or they may recommend the Pap and pelvic exam, combined with testing for human papilloma virus (HPV), every 5 years. Some types of HPV increase your risk of cervical cancer. Testing for HPV may also be done on women of any age with unclear Pap test results.  Other health care providers may not recommend any screening for nonpregnant women who are considered low risk for pelvic cancer and who do not have symptoms. Ask your health care provider if a screening pelvic exam is right for you.  If you have had past treatment for cervical cancer or a condition that could lead to cancer, you need Pap tests and screening for cancer for at least 20 years after your treatment. If Pap tests have been discontinued, your risk factors (such as having a new sexual partner) need to be reassessed to determine if screening should resume. Some women have medical problems that increase the chance of getting cervical cancer. In these cases, your health care provider may recommend more frequent screening and Pap tests.  Colorectal Cancer  This type of cancer can be detected and often prevented.  Routine colorectal cancer screening usually begins at 42 years of age and continues through 42 years of age.  Your health care provider may recommend screening at an earlier age if you have risk factors for colon cancer.  Your health care provider may also recommend using home test kits to check for hidden blood in the stool.  A small camera at the end of a tube can be used to examine your colon directly (sigmoidoscopy or colonoscopy). This is done to check for the earliest forms of  colorectal cancer.  Routine screening usually begins at age 50.  Direct examination of the colon should be repeated every 5-10 years through 42 years of age. However, you may need to be screened more often if early forms of precancerous polyps or small growths are found.  Skin Cancer  Check your skin from head to toe regularly.  Tell your health care provider about any new moles or changes in moles, especially if there is a change in a mole's shape or color.  Also tell your health care provider if you have a mole that is larger than the size of a pencil eraser.  Always use sunscreen. Apply sunscreen liberally and repeatedly throughout the day.  Protect yourself by wearing long sleeves, pants, a wide-brimmed hat, and sunglasses whenever you are outside.  Heart disease, diabetes, and high blood pressure  High blood pressure causes heart disease and increases the risk of stroke. High blood pressure is more likely to develop in: ? People who have blood pressure   in the high end of the normal range (130-139/85-89 mm Hg). ? People who are overweight or obese. ? People who are African American.  If you are 28-40 years of age, have your blood pressure checked every 3-5 years. If you are 66 years of age or older, have your blood pressure checked every year. You should have your blood pressure measured twice-once when you are at a hospital or clinic, and once when you are not at a hospital or clinic. Record the average of the two measurements. To check your blood pressure when you are not at a hospital or clinic, you can use: ? An automated blood pressure machine at a pharmacy. ? A home blood pressure monitor.  If you are between 22 years and 24 years old, ask your health care provider if you should take aspirin to prevent strokes.  Have regular diabetes screenings. This involves taking a blood sample to check your fasting blood sugar level. ? If you are at a normal weight and have a low risk  for diabetes, have this test once every three years after 42 years of age. ? If you are overweight and have a high risk for diabetes, consider being tested at a younger age or more often. Preventing infection Hepatitis B  If you have a higher risk for hepatitis B, you should be screened for this virus. You are considered at high risk for hepatitis B if: ? You were born in a country where hepatitis B is common. Ask your health care provider which countries are considered high risk. ? Your parents were born in a high-risk country, and you have not been immunized against hepatitis B (hepatitis B vaccine). ? You have HIV or AIDS. ? You use needles to inject street drugs. ? You live with someone who has hepatitis B. ? You have had sex with someone who has hepatitis B. ? You get hemodialysis treatment. ? You take certain medicines for conditions, including cancer, organ transplantation, and autoimmune conditions.  Hepatitis C  Blood testing is recommended for: ? Everyone born from [birth year] through 1965. ? Anyone with known risk factors for hepatitis C.  Sexually transmitted infections (STIs)  You should be screened for sexually transmitted infections (STIs) including gonorrhea and chlamydia if: ? You are sexually active and are younger than 42 years of age. ? You are older than 42 years of age and your health care provider tells you that you are at risk for this type of infection. ? Your sexual activity has changed since you were last screened and you are at an increased risk for chlamydia or gonorrhea. Ask your health care provider if you are at risk.  If you do not have HIV, but are at risk, it may be recommended that you take a prescription medicine daily to prevent HIV infection. This is called pre-exposure prophylaxis (PrEP). You are considered at risk if: ? You are sexually active and do not regularly use condoms or know the HIV status of your partner(s). ? You take drugs by  injection. ? You are sexually active with a partner who has HIV.  Talk with your health care provider about whether you are at high risk of being infected with HIV. If you choose to begin PrEP, you should first be tested for HIV. You should then be tested every 3 months for as long as you are taking PrEP. Pregnancy  If you are premenopausal and you may become pregnant, ask your health care provider about preconception counseling.  If you may become pregnant, take 400 to 800 micrograms (mcg) of folic acid every day.  If you want to prevent pregnancy, talk to your health care provider about birth control (contraception). Osteoporosis and menopause  Osteoporosis is a disease in which the bones lose minerals and strength with aging. This can result in serious bone fractures. Your risk for osteoporosis can be identified using a bone density scan.  If you are 17 years of age or older, or if you are at risk for osteoporosis and fractures, ask your health care provider if you should be screened.  Ask your health care provider whether you should take a calcium or vitamin D supplement to lower your risk for osteoporosis.  Menopause may have certain physical symptoms and risks.  Hormone replacement therapy may reduce some of these symptoms and risks. Talk to your health care provider about whether hormone replacement therapy is right for you. Follow these instructions at home:  Schedule regular health, dental, and eye exams.  Stay current with your immunizations.  Do not use any tobacco products including cigarettes, chewing tobacco, or electronic cigarettes.  If you are pregnant, do not drink alcohol.  If you are breastfeeding, limit how much and how often you drink alcohol.  Limit alcohol intake to no more than 1 drink per day for nonpregnant women. One drink equals 12 ounces of beer, 5 ounces of wine, or 1 ounces of hard liquor.  Do not use street drugs.  Do not share needles.  Ask  your health care provider for help if you need support or information about quitting drugs.  Tell your health care provider if you often feel depressed.  Tell your health care provider if you have ever been abused or do not feel safe at home. This information is not intended to replace advice given to you by your health care provider. Make sure you discuss any questions you have with your health care provider. Document Released: 04/27/2011 Document Revised: 03/19/2016 Document Reviewed: 07/16/2015 Elsevier Interactive Patient Education  [employer].

## 2017-12-10 NOTE — Progress Notes (Signed)
   HPI  Patient presents today for an annual physical exam.  Patient feels well and has no complaints today. She is frustrated with her weight, she is exercising regularly and now working with a nutritionist to try to make healthier choices.  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  She had a normal pap last year, She also had a normal mammo last year.   PMH: Smoking status noted ROS: Per HPI  Objective: BP 111/69   Pulse 83   Temp 99.1 F (37.3 C) (Oral)   Ht 5' 3"  (1.6 m)   Wt 191 lb 3.2 oz (86.7 kg)   BMI 33.87 kg/m  Gen: NAD, alert, cooperative with exam HEENT: NCAT, EOMI, PERRL, oropharynx moist and clear, nares clear CV: RRR, good S1/S2, no murmur Resp: CTABL, no wheezes, non-labored Abd: SNTND, BS present, no guarding or organomegaly Ext: No edema, warm Neuro: Alert and oriented, No gross deficits, 2+ symmetric patellar tendon reflexes bilaterally  Assessment and plan:  #Annual physical exam Normal exam except for weight Patient is up-to-date with Pap smear and mammogram We discussed her weight today, she is actively trying to lose weight. Labs-nonfasting     Orders Placed This Encounter  Procedures  . CMP14+EGFR  . CBC with Differential/Platelet  . Lipid panel  . TSH     Laroy Apple, MD [facility] 12/10/2017, 10:20 AM

## 2017-12-11 LAB — CBC WITH DIFFERENTIAL/PLATELET
BASOS ABS: 0 10*3/uL (ref 0.0–0.2)
Basos: 1 %
EOS (ABSOLUTE): 0.4 10*3/uL (ref 0.0–0.4)
EOS: 9 %
HEMATOCRIT: 34.9 % (ref 34.0–46.6)
HEMOGLOBIN: 11.1 g/dL (ref 11.1–15.9)
IMMATURE GRANS (ABS): 0 10*3/uL (ref 0.0–0.1)
IMMATURE GRANULOCYTES: 0 %
LYMPHS ABS: 1.4 10*3/uL (ref 0.7–3.1)
LYMPHS: 34 %
MCH: 27.5 pg (ref 26.6–33.0)
MCHC: 31.8 g/dL (ref 31.5–35.7)
MCV: 87 fL (ref 79–97)
MONOCYTES: 8 %
Monocytes Absolute: 0.3 10*3/uL (ref 0.1–0.9)
NEUTROS PCT: 48 %
Neutrophils Absolute: 2.1 10*3/uL (ref 1.4–7.0)
Platelets: 316 10*3/uL (ref 150–379)
RBC: 4.03 x10E6/uL (ref 3.77–5.28)
RDW: 13.1 % (ref 12.3–15.4)
WBC: 4.2 10*3/uL (ref 3.4–10.8)

## 2017-12-11 LAB — LIPID PANEL
CHOL/HDL RATIO: 2.5 ratio (ref 0.0–4.4)
CHOLESTEROL TOTAL: 170 mg/dL (ref 100–199)
HDL: 67 mg/dL (ref >39)
LDL CALC: 96 mg/dL (ref 0–99)
TRIGLYCERIDES: 35 mg/dL (ref 0–149)
VLDL Cholesterol Cal: 7 mg/dL (ref 5–40)

## 2017-12-11 LAB — CMP14+EGFR
ALBUMIN: 4.1 g/dL (ref 3.5–5.5)
ALK PHOS: 72 IU/L (ref 39–117)
ALT: 23 IU/L (ref 0–32)
AST: 19 IU/L (ref 0–40)
Albumin/Globulin Ratio: 1.5 (ref 1.2–2.2)
BUN/Creatinine Ratio: 21 (ref 9–23)
BUN: 15 mg/dL (ref 6–24)
CHLORIDE: 106 mmol/L (ref 96–106)
CO2: 22 mmol/L (ref 20–29)
Calcium: 9.4 mg/dL (ref 8.7–10.2)
Creatinine, Ser: 0.7 mg/dL (ref 0.57–1.00)
GFR calc Af Amer: 124 mL/min/{1.73_m2} (ref >59)
GFR calc non Af Amer: 107 mL/min/{1.73_m2} (ref >59)
GLUCOSE: 71 mg/dL (ref 65–99)
Globulin, Total: 2.8 g/dL (ref 1.5–4.5)
Potassium: 4.8 mmol/L (ref 3.5–5.2)
Sodium: 142 mmol/L (ref 134–144)
Total Protein: 6.9 g/dL (ref 6.0–8.5)

## 2017-12-11 LAB — TSH: TSH: 1.18 u[IU]/mL (ref 0.450–4.500)

## 2017-12-29 ENCOUNTER — Ambulatory Visit: Payer: BLUE CROSS/BLUE SHIELD | Admitting: Family Medicine

## 2017-12-29 ENCOUNTER — Encounter: Payer: Self-pay | Admitting: Family Medicine

## 2017-12-29 VITALS — BP 116/74 | HR 94 | Temp 97.4°F | Ht 63.0 in | Wt 187.4 lb

## 2017-12-29 DIAGNOSIS — R52 Pain, unspecified: Secondary | ICD-10-CM | POA: Diagnosis not present

## 2017-12-29 DIAGNOSIS — J020 Streptococcal pharyngitis: Secondary | ICD-10-CM

## 2017-12-29 DIAGNOSIS — J029 Acute pharyngitis, unspecified: Secondary | ICD-10-CM

## 2017-12-29 LAB — VERITOR FLU A/B WAIVED
INFLUENZA A: NEGATIVE
INFLUENZA B: NEGATIVE

## 2017-12-29 LAB — RAPID STREP SCREEN (MED CTR MEBANE ONLY): Strep Gp A Ag, IA W/Reflex: POSITIVE — AB

## 2017-12-29 MED ORDER — AMOXICILLIN-POT CLAVULANATE 875-125 MG PO TABS: 1.0000 | ORAL_TABLET | Freq: Two times a day (BID) | ORAL | 0 refills | Status: DC

## 2017-12-29 NOTE — Progress Notes (Signed)
Chief Complaint  Patient presents with  . Sore Throat    pt here today c/o sore throat and ears burning    HPI  Patient presents today for Patient presents with upper respiratory congestion.  There is moderate sore throat. Patient reports no cough or rhinorrhea. There is no fever, chills or sweats. The patient denies being short of breath. Onset was yesterday. Gradually worsening. Daughter has similar sx. 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  PMH: Smoking status noted ROS: Per HPI  Objective: BP 116/74   Pulse 94   Temp (!) 97.4 F (36.3 C) (Oral)   Ht 5' 3"  (1.6 m)   Wt 187 lb 6 oz (85 kg)   BMI 33.19 kg/m  Gen: NAD, alert, cooperative with exam HEENT: NCAT, posterior pharynx swollen, red. TMS nml CV: RRR, good S1/S2, no murmur Resp:CTA Ext: No edema, warm Neuro: Alert and oriented, No gross deficits  Assessment and plan:  1. Strep pharyngitis   2. Sore throat   3. Body aches     Meds ordered this encounter  Medications  . amoxicillin-clavulanate (AUGMENTIN) 875-125 MG tablet    Sig: Take 1 tablet by mouth 2 (two) times daily.    Dispense:  20 tablet    Refill:  0    Orders Placed This Encounter  Procedures  . Rapid Strep Screen (Not at [facility])  . Veritor Flu A/B Waived    Order Specific Question:   Source    Answer:   nasal    Follow up as needed.  Claretta Fraise, MD

## 2018-03-14 ENCOUNTER — Encounter: Payer: Self-pay | Admitting: Family

## 2018-03-14 ENCOUNTER — Ambulatory Visit: Payer: BLUE CROSS/BLUE SHIELD | Admitting: Family

## 2018-03-14 VITALS — BP 112/75 | HR 74 | Temp 98.9°F | Ht 63.0 in | Wt 188.0 lb

## 2018-03-14 DIAGNOSIS — N898 Other specified noninflammatory disorders of vagina: Secondary | ICD-10-CM | POA: Diagnosis not present

## 2018-03-14 DIAGNOSIS — B373 Candidiasis of vulva and vagina: Secondary | ICD-10-CM | POA: Diagnosis not present

## 2018-03-14 DIAGNOSIS — N760 Acute vaginitis: Secondary | ICD-10-CM | POA: Diagnosis not present

## 2018-03-14 DIAGNOSIS — B9689 Other specified bacterial agents as the cause of diseases classified elsewhere: Secondary | ICD-10-CM | POA: Diagnosis not present

## 2018-03-14 DIAGNOSIS — B3731 Acute candidiasis of vulva and vagina: Secondary | ICD-10-CM

## 2018-03-14 LAB — WET PREP FOR TRICH, YEAST, CLUE
Clue Cell Exam: NEGATIVE
TRICHOMONAS EXAM: NEGATIVE
Yeast Exam: NEGATIVE

## 2018-03-14 MED ORDER — CLOTRIMAZOLE 2 % VA CREA: 1.0000 | TOPICAL_CREAM | Freq: Every day | VAGINAL | 0 refills | Status: DC

## 2018-03-14 MED ORDER — METRONIDAZOLE 500 MG PO TABS: 500.0000 mg | ORAL_TABLET | Freq: Two times a day (BID) | ORAL | 0 refills | Status: DC

## 2018-03-14 NOTE — Patient Instructions (Signed)
Bacterial Vaginosis Bacterial vaginosis is a vaginal infection that occurs when the normal balance of bacteria in the vagina is disrupted. It results from an overgrowth of certain bacteria. This is the most common vaginal infection among women ages 5-44. Because bacterial vaginosis increases your risk for STIs (sexually transmitted infections), getting treated can help reduce your risk for chlamydia, gonorrhea, herpes, and HIV (human immunodeficiency virus). Treatment is also important for preventing complications in pregnant women, because this condition can cause an early (premature) delivery. What are the causes? This condition is caused by an increase in harmful bacteria that are normally present in small amounts in the vagina. However, the reason that the condition develops is not fully understood. What increases the risk? The following factors may make you more likely to develop this condition:  Having a new sexual partner or multiple sexual partners.  Having unprotected sex.  Douching.  Having an intrauterine device (IUD).  Smoking.  Drug and alcohol abuse.  Taking certain antibiotic medicines.  Being pregnant.  You cannot get bacterial vaginosis from toilet seats, bedding, swimming pools, or contact with objects around you. What are the signs or symptoms? Symptoms of this condition include:  Grey or white vaginal discharge. The discharge can also be watery or foamy.  A fish-like odor with discharge, especially after sexual intercourse or during menstruation.  Itching in and around the vagina.  Burning or pain with urination.  Some women with bacterial vaginosis have no signs or symptoms. How is this diagnosed? This condition is diagnosed based on:  Your medical history.  A physical exam of the vagina.  Testing a sample of vaginal fluid under a microscope to look for a large amount of bad bacteria or abnormal cells. Your health care provider may use a cotton swab  or a small wooden spatula to collect the sample.  How is this treated? This condition is treated with antibiotics. These may be given as a pill, a vaginal cream, or a medicine that is put into the vagina (suppository). If the condition comes back after treatment, a second round of antibiotics may be needed. Follow these instructions at home: Medicines  Take over-the-counter and prescription medicines only as told by your health care provider.  Take or use your antibiotic as told by your health care provider. Do not stop taking or using the antibiotic even if you start to feel better. General instructions  If you have a female sexual partner, tell her that you have a vaginal infection. She should see her health care provider and be treated if she has symptoms. If you have a female sexual partner, he does not need treatment.  During treatment: ? Avoid sexual activity until you finish treatment. ? Do not douche. ? Avoid alcohol as directed by your health care provider. ? Avoid breastfeeding as directed by your health care provider.  Drink enough water and fluids to keep your urine clear or pale yellow.  Keep the area around your vagina and rectum clean. ? Wash the area daily with warm water. ? Wipe yourself from front to back after using the toilet.  Keep all follow-up visits as told by your health care provider. This is important. How is this prevented?  Do not douche.  Wash the outside of your vagina with warm water only.  Use protection when having sex. This includes latex condoms and dental dams.  Limit how many sexual partners you have. To help prevent bacterial vaginosis, it is best to have sex with just  one partner (monogamous).  Make sure you and your sexual partner are tested for STIs.  Wear cotton or cotton-lined underwear.  Avoid wearing tight pants and pantyhose, especially during summer.  Limit the amount of alcohol that you drink.  Do not use any products that  contain nicotine or tobacco, such as cigarettes and e-cigarettes. If you need help quitting, ask your health care provider.  Do not use illegal drugs. Where to find more information:  Centers for Disease Control and Prevention: AppraiserFraud.fi  American Sexual Health Association (ASHA): www.ashastd.org  U.S. Department of Health and Financial controller, Office on Women's Health: DustingSprays.pl or SecuritiesCard.it Contact a health care provider if:  Your symptoms do not improve, even after treatment.  You have more discharge or pain when urinating.  You have a fever.  You have pain in your abdomen.  You have pain during sex.  You have vaginal bleeding between periods. Summary  Bacterial vaginosis is a vaginal infection that occurs when the normal balance of bacteria in the vagina is disrupted.  Because bacterial vaginosis increases your risk for STIs (sexually transmitted infections), getting treated can help reduce your risk for chlamydia, gonorrhea, herpes, and HIV (human immunodeficiency virus). Treatment is also important for preventing complications in pregnant women, because the condition can cause an early (premature) delivery.  This condition is treated with antibiotic medicines. These may be given as a pill, a vaginal cream, or a medicine that is put into the vagina (suppository). This information is not intended to replace advice given to you by your health care provider. Make sure you discuss any questions you have with your health care provider. Document Released: 10/12/2005 Document Revised: 02/15/2017 Document Reviewed: 06/27/2016 Elsevier Interactive Patient Education  [employer].

## 2018-03-14 NOTE — Progress Notes (Signed)
   Subjective:    Patient ID: Tanya Mccoy, female    DOB: 1976-04-20, 42 y.o.   MRN: [id number]  Vaginal Itching  The patient's primary symptoms include genital itching, a genital odor and vaginal discharge. The patient's pertinent negatives include no vaginal bleeding. This is a new problem. The current episode started more than 1 month ago. The problem occurs intermittently. The problem has been waxing and waning. The patient is experiencing no pain. Associated symptoms include dysuria. Pertinent negatives include no discolored urine, flank pain, hematuria, sore throat or urgency. The vaginal discharge was yellow and clear. She has tried antifungals for the symptoms. The treatment provided mild relief.      Review of Systems  HENT: Negative for sore throat.   Genitourinary: Positive for dysuria and vaginal discharge. Negative for flank pain, hematuria and urgency.  All other systems reviewed and are negative.      Objective:   Physical Exam  Constitutional: She is oriented to person, place, and time. She appears well-developed and well-nourished. No distress.  HENT:  Head: Normocephalic and atraumatic.  Right Ear: External ear normal.  Left Ear: External ear normal.  Mouth/Throat: Oropharynx is clear and moist.  Eyes: Pupils are equal, round, and reactive to light.  Neck: Normal range of motion. Neck supple. No thyromegaly present.  Cardiovascular: Normal rate, regular rhythm, normal heart sounds and intact distal pulses.  No murmur heard. Pulmonary/Chest: Effort normal and breath sounds normal. No respiratory distress. She has no wheezes.  Abdominal: Soft. Bowel sounds are normal. She exhibits no distension. There is no tenderness.  Genitourinary: Vagina normal. Labial rash: white discharge. Labial rash: white discharge.  Musculoskeletal: Normal range of motion. She exhibits no edema or tenderness.  Neurological: She is alert and oriented to person, place, and time.  She has normal reflexes. No cranial nerve deficit.  Skin: Skin is warm and dry.  Psychiatric: She has a normal mood and affect. Her behavior is normal. Judgment and thought content normal.  Vitals reviewed.     BP 112/75   Pulse 74   Temp 98.9 F (37.2 C) (Oral)   Ht 5' 3"  (1.6 m)   Wt 188 lb (85.3 kg)   BMI 33.30 kg/m      Assessment & Plan:  Djuana was seen today for vaginal itching and discharge.  Diagnoses and all orders for this visit:  Vaginal itching -     WET PREP FOR TRICH, YEAST, CLUE  Bacterial vaginosis -     metroNIDAZOLE (FLAGYL) 500 MG tablet; Take 1 tablet (500 mg total) by mouth 2 (two) times daily.  Vagina, candidiasis -     clotrimazole (GYNE-LOTRIMIN 3) 2 % vaginal cream; Place 1 Applicatorful vaginally at bedtime.   Probiotic  Cotton underwear Do not scratch  RTO if symptoms worsen or do not improve  Evelina Dun, FNP

## 2018-03-23 ENCOUNTER — Telehealth: Payer: Self-pay | Admitting: Family Medicine

## 2018-03-23 NOTE — Telephone Encounter (Signed)
Pt notified that there is no documentation of Tdap

## 2018-07-01 ENCOUNTER — Encounter: Payer: Self-pay | Admitting: Physician Assistant

## 2018-07-01 ENCOUNTER — Ambulatory Visit (INDEPENDENT_AMBULATORY_CARE_PROVIDER_SITE_OTHER): Payer: BLUE CROSS/BLUE SHIELD

## 2018-07-01 ENCOUNTER — Ambulatory Visit: Payer: BLUE CROSS/BLUE SHIELD | Admitting: Physician Assistant

## 2018-07-01 VITALS — BP 123/77 | HR 87 | Temp 97.8°F | Ht 63.0 in | Wt 181.4 lb

## 2018-07-01 DIAGNOSIS — S92521A Displaced fracture of medial phalanx of right lesser toe(s), initial encounter for closed fracture: Secondary | ICD-10-CM

## 2018-07-01 DIAGNOSIS — S99921A Unspecified injury of right foot, initial encounter: Secondary | ICD-10-CM

## 2018-07-01 DIAGNOSIS — S92511A Displaced fracture of proximal phalanx of right lesser toe(s), initial encounter for closed fracture: Secondary | ICD-10-CM | POA: Diagnosis not present

## 2018-07-01 NOTE — Progress Notes (Signed)
BP 123/77   Pulse 87   Temp 97.8 F (36.6 C) (Oral)   Ht 5' 3"  (1.6 m)   Wt 181 lb 6.4 oz (82.3 kg)   BMI 32.13 kg/m    Subjective:    Patient ID: Tanya Mccoy, female    DOB: 16-Jun-1976, 42 y.o.   MRN: [id number]  HPI: Tanya Mccoy is a 42 y.o. female presenting on 07/01/2018 for Toe Injury (right 3rd)  1 day ago the patient hit her right foot and had significant pain in the third toe.  It happened in the evening time.  When she got up this morning it was hurting significantly.  It hurt to put her weight down on it.  She noted that there was a little bit of displacement of the toe.  She is quite active.  Of encouraged her not to do too much as far as walking hiking or standing  Past Medical History:  Diagnosis Date  . Allergy    Relevant past medical, surgical, family and social history reviewed and updated as indicated. Interim medical history since our last visit reviewed. Allergies and medications reviewed and updated. DATA REVIEWED: CHART IN EPIC  Family History reviewed for pertinent findings.  Review of Systems  Constitutional: Negative.  Negative for activity change, fatigue and fever.  HENT: Negative.   Eyes: Negative.   Respiratory: Negative.  Negative for cough.   Cardiovascular: Negative.  Negative for chest pain.  Gastrointestinal: Negative.  Negative for abdominal pain.  Endocrine: Negative.   Genitourinary: Negative.  Negative for dysuria.  Musculoskeletal: Positive for arthralgias and joint swelling.  Skin: Negative.   Neurological: Negative.     Allergies as of 07/01/2018   No Known Allergies     Medication List        Accurate as of 07/01/18 10:41 AM. Always use your most recent med list.          cetirizine 10 MG tablet Commonly known as:  ZYRTEC TAKE 1 TABLET (10 MG TOTAL) BY MOUTH DAILY.   fluticasone 50 MCG/ACT nasal spray Commonly known as:  FLONASE Place 2 sprays into both nostrils daily.   multivitamin with  minerals Tabs tablet Take 1 tablet by mouth daily.            Durable Medical Equipment  (From admission, onward)         Start     Ordered   07/01/18 0000  DME Other see comment    Comments:  Post op shoe   07/01/18 0917             Objective:    BP 123/77   Pulse 87   Temp 97.8 F (36.6 C) (Oral)   Ht 5' 3"  (1.6 m)   Wt 181 lb 6.4 oz (82.3 kg)   BMI 32.13 kg/m   No Known Allergies  Wt Readings from Last 3 Encounters:  07/01/18 181 lb 6.4 oz (82.3 kg)  03/14/18 188 lb (85.3 kg)  12/29/17 187 lb 6 oz (85 kg)    Physical Exam  Constitutional: She appears well-developed and well-nourished.  HENT:  Head: Normocephalic and atraumatic.  Musculoskeletal:       Right foot: There is decreased range of motion, tenderness and deformity.       Feet:       Assessment & Plan:   1. Injury of toe on right foot, initial encounter - DG Foot Complete Right; Future - Ambulatory referral to Orthopedic Surgery  2.  Closed displaced fracture of middle phalanx of lesser toe of right foot, initial encounter - Ambulatory referral to Orthopedic Surgery - DME Other see comment Limit activity Wear shoe Ice as much as possible  Continue all other maintenance medications as listed above.  Follow up plan: No follow-ups on file.  Educational handout given for fractured toe  Terald Sleeper PA-C [facility] [address]  [place], [place] [address] [phone]   07/01/2018, 10:41 AM

## 2018-07-01 NOTE — Patient Instructions (Signed)
Toe Fracture A toe fracture is a break in one of the toe bones (phalanges). Follow these instructions at home: If you have a cast:  Do not stick anything inside the cast to scratch your skin.  Check the skin around the cast every day. Tell your doctor about any concerns. Do not put lotion on the skin underneath the cast. You may put lotion on dry skin around the edges of the cast.  Do not put pressure on any part of the cast until it is fully hardened. This may take many hours.  Keep the cast clean and dry. Bathing  Do not take baths, swim, or use a hot tub until your doctor says that you can. Ask your doctor if you can take showers. You may only be allowed to take sponge baths for bathing.  If your doctor says that bathing and showering are okay, cover the cast or bandage (dressing) with a watertight plastic bag to protect it from water. Do not let the cast or bandage get wet. Managing pain, stiffness, and swelling  If you do not have a cast, put ice on the injured area if told by your doctor: ? Put ice in a plastic bag. ? Place a towel between your skin and the bag. ? Leave the ice on for 20 minutes, 2-3 times per day.  Move your toes often to avoid stiffness and to lessen swelling.  Raise (elevate) the injured area above the level of your heart while you are sitting or lying down. Driving  Do not drive or use heavy machinery while taking pain medicine.  Do not drive while wearing a cast on a foot that you use for driving. Activity  Return to your normal activities as told by your doctor. Ask your doctor what activities are safe for you.  Perform exercises daily as told by your doctor or therapist. Safety  Do not use your leg to support your body weight until your doctor says that you can. Use crutches or other tools to help you move around as told by your doctor. General instructions  If your toe was taped to a toe that is next to it (buddy taping), follow your doctor's  instructions for changing the gauze and tape. Change it more often: ? If the gauze and tape get wet. If this happens, dry the space between the toes. ? If the gauze and tape are too tight and they cause your toe to become pale or to lose feeling (numb).  Wear a protective shoe as told by your doctor. If you were not given one, wear sturdy shoes that support your foot. Your shoes should not pinch your toes. Your shoes should not fit tightly against your toes.  Do not use any tobacco products, including cigarettes, chewing tobacco, or e-cigarettes. Tobacco can delay bone healing. If you need help quitting, ask your doctor.  Take medicines only as told by your doctor.  Keep all follow-up visits as told by your doctor. This is important. Contact a doctor if:  You have a fever.  Your pain medicine is not helping.  Your toe feels cold.  You lose feeling (have numbness) in your toe.  You still have pain after one week of rest and treatment.  You still have pain after your doctor has said that you can start walking again.  You have pain or tingling in your foot, and it is not going away.  You have loss of feeling in your foot, and it is  not going away. Get help right away if:  You have severe pain.  You have redness or swelling (inflammation) in your toe, and it is getting worse.  You have pain or loss of feeling in your toe, and it is getting worse.  Your toe is blue. This information is not intended to replace advice given to you by your health care provider. Make sure you discuss any questions you have with your health care provider. Document Released: 03/30/2008 Document Revised: 06/15/2016 Document Reviewed: 08/08/2014 Elsevier Interactive Patient Education  [employer].

## 2018-07-06 DIAGNOSIS — M79674 Pain in right toe(s): Secondary | ICD-10-CM | POA: Diagnosis not present

## 2018-07-06 DIAGNOSIS — S92514A Nondisplaced fracture of proximal phalanx of right lesser toe(s), initial encounter for closed fracture: Secondary | ICD-10-CM | POA: Diagnosis not present

## 2018-07-06 DIAGNOSIS — S92919A Unspecified fracture of unspecified toe(s), initial encounter for closed fracture: Secondary | ICD-10-CM | POA: Insufficient documentation

## 2018-07-26 DIAGNOSIS — S92514D Nondisplaced fracture of proximal phalanx of right lesser toe(s), subsequent encounter for fracture with routine healing: Secondary | ICD-10-CM | POA: Diagnosis not present

## 2018-09-16 ENCOUNTER — Telehealth: Payer: Self-pay | Admitting: Pediatrics

## 2019-06-15 ENCOUNTER — Encounter: Payer: BLUE CROSS/BLUE SHIELD | Admitting: Family

## 2019-07-13 DIAGNOSIS — Z111 Encounter for screening for respiratory tuberculosis: Secondary | ICD-10-CM | POA: Diagnosis not present

## 2019-07-15 DIAGNOSIS — Z111 Encounter for screening for respiratory tuberculosis: Secondary | ICD-10-CM | POA: Diagnosis not present

## 2019-07-15 DIAGNOSIS — Z021 Encounter for pre-employment examination: Secondary | ICD-10-CM | POA: Diagnosis not present

## 2019-07-15 DIAGNOSIS — Z23 Encounter for immunization: Secondary | ICD-10-CM | POA: Diagnosis not present

## 2019-08-01 DIAGNOSIS — Z1151 Encounter for screening for human papillomavirus (HPV): Secondary | ICD-10-CM | POA: Diagnosis not present

## 2019-08-01 DIAGNOSIS — Z6833 Body mass index (BMI) 33.0-33.9, adult: Secondary | ICD-10-CM | POA: Diagnosis not present

## 2019-08-01 DIAGNOSIS — Z01419 Encounter for gynecological examination (general) (routine) without abnormal findings: Secondary | ICD-10-CM | POA: Diagnosis not present

## 2019-08-22 DIAGNOSIS — Z1231 Encounter for screening mammogram for malignant neoplasm of breast: Secondary | ICD-10-CM | POA: Diagnosis not present

## 2019-09-09 DIAGNOSIS — R928 Other abnormal and inconclusive findings on diagnostic imaging of breast: Secondary | ICD-10-CM | POA: Diagnosis not present

## 2019-09-11 ENCOUNTER — Telehealth: Payer: Self-pay | Admitting: Pediatrics

## 2019-09-12 NOTE — Telephone Encounter (Signed)
Mammogram faxed to [facility]

## 2019-09-26 DIAGNOSIS — D225 Melanocytic nevi of trunk: Secondary | ICD-10-CM | POA: Diagnosis not present

## 2019-09-26 DIAGNOSIS — D229 Melanocytic nevi, unspecified: Secondary | ICD-10-CM | POA: Diagnosis not present

## 2019-09-26 DIAGNOSIS — L905 Scar conditions and fibrosis of skin: Secondary | ICD-10-CM | POA: Diagnosis not present

## 2019-10-09 DIAGNOSIS — N6310 Unspecified lump in the right breast, unspecified quadrant: Secondary | ICD-10-CM | POA: Diagnosis not present

## 2019-10-09 DIAGNOSIS — R928 Other abnormal and inconclusive findings on diagnostic imaging of breast: Secondary | ICD-10-CM | POA: Diagnosis not present

## 2019-11-20 ENCOUNTER — Other Ambulatory Visit: Payer: Self-pay

## 2019-11-20 NOTE — Progress Notes (Signed)
Assessment & Plan:  1. Well adult exam - Preventive health care education provided. Patient reports she is UTD with pap smear and mammogram - we are requesting this record from [facility] in [place]. Declined HIV screening. Also UTD with TDAP and influenza.  - CBC with Differential/Platelet - CMP14+EGFR - Lipid panel - Thyroid Panel With TSH  2. Seasonal allergies - Well controlled on current regimen.   3. Family history of diabetes mellitus - Bayer DCA Hb A1c Waived  4. Weight gain - Continue diet and exercise.  - Thyroid Panel With TSH - Bayer DCA Hb A1c Waived  5. Family history of thyroid disease - Thyroid Panel With TSH   Follow-up: Return in about 1 year (around 11/20/2020) for annual physical.   Hendricks Limes, MSN, APRN, FNP-C [facility]  Subjective:  Patient ID: Tanya Mccoy, female    DOB: [birth year]  Age: 44 y.o. MRN: [id number]  Patient Care Team: Loman Brooklyn, FNP as PCP - General (Family Medicine) Cox, De Hollingshead, MD as Consulting Physician (Nurse Practitioner)   CC:  Chief Complaint  Patient presents with  . Annual Exam    labs- Patient states she has gained 15lbs in the last month.  . Establish Care    HPI Eupha Lobb Perrault-Manuel presents for her annual physical.   Occupation: Pharmacist, hospital, Marital status: widowed, Substance use: none Diet: tracking with what she eats; lower carb;more protein, Exercise: beach body 5 days a week for the past 6 months Last eye exam: 10/2018; scheduled for next month Last dental exam: 5 months ago Last mammogram: 2020 - requested records Last pap smear: 2020 - requesting records Immunizations: Flu Vaccine: UTD  Tdap Vaccine: UTD   DEPRESSION SCREENING PHQ 2/9 Scores 11/21/2019 07/01/2018 03/14/2018 12/10/2017 09/14/2017 01/11/2017 12/04/2016  PHQ - 2 Score 0 0 0 0 0 0 0    Patient has concerns about weight gain. Reports she has gained 15 lbs in the past month despite diet and  exercise. She is still having regular periods.    Review of Systems  Constitutional: Negative for chills, fever, malaise/fatigue and weight loss.  HENT: Negative for congestion, ear discharge, ear pain, nosebleeds, sinus pain, sore throat and tinnitus.   Eyes: Negative for blurred vision, double vision, pain, discharge and redness.  Respiratory: Negative for cough, shortness of breath and wheezing.   Cardiovascular: Negative for chest pain, palpitations and leg swelling.  Gastrointestinal: Negative for abdominal pain, constipation, diarrhea, heartburn, nausea and vomiting.  Genitourinary: Negative for dysuria, frequency and urgency.  Musculoskeletal: Negative for myalgias.  Skin: Negative for rash.  Neurological: Negative for dizziness, seizures, weakness and headaches.  Psychiatric/Behavioral: Negative for depression, substance abuse and suicidal ideas. The patient is not nervous/anxious.     Current Outpatient Medications:  .  cetirizine (ZYRTEC) 10 MG tablet, TAKE 1 TABLET (10 MG TOTAL) BY MOUTH DAILY., Disp: 30 tablet, Rfl: 5 .  fluticasone (FLONASE) 50 MCG/ACT nasal spray, Place 2 sprays into both nostrils daily., Disp: 16 g, Rfl: 6 .  Multiple Vitamin (MULTIVITAMIN WITH MINERALS) TABS tablet, Take 1 tablet by mouth daily., Disp: , Rfl:   No Known Allergies  Past Medical History:  Diagnosis Date  . Allergy     Past Surgical History:  Procedure Laterality Date  . CESAREAN SECTION      Family History  Problem Relation Age of Onset  . Diabetes Mother   . Healthy Father   . Autoimmune disease Sister   . Thyroid disease Sister  Social History   Socioeconomic History  . Marital status: Widowed    Spouse name: Not on file  . Number of children: Not on file  . Years of education: Not on file  . Highest education level: Not on file  Occupational History  . Not on file  Tobacco Use  . Smoking status: Never Smoker  . Smokeless tobacco: Never Used  Substance and  Sexual Activity  . Alcohol use: Yes    Comment: occ  . Drug use: No  . Sexual activity: Not Currently  Other Topics Concern  . Not on file  Social History Narrative  . Not on file   Social Determinants of Health   Financial Resource Strain:   . Difficulty of Paying Living Expenses: Not on file  Food Insecurity:   . Worried About Charity fundraiser in the Last Year: Not on file  . Ran Out of Food in the Last Year: Not on file  Transportation Needs:   . Lack of Transportation (Medical): Not on file  . Lack of Transportation (Non-Medical): Not on file  Physical Activity:   . Days of Exercise per Week: Not on file  . Minutes of Exercise per Session: Not on file  Stress:   . Feeling of Stress : Not on file  Social Connections:   . Frequency of Communication with Friends and Family: Not on file  . Frequency of Social Gatherings with Friends and Family: Not on file  . Attends Religious Services: Not on file  . Active Member of Clubs or Organizations: Not on file  . Attends Archivist Meetings: Not on file  . Marital Status: Not on file  Intimate Partner Violence:   . Fear of Current or Ex-Partner: Not on file  . Emotionally Abused: Not on file  . Physically Abused: Not on file  . Sexually Abused: Not on file      Objective:    BP 112/69   Pulse 83   Temp 98.4 F (36.9 C) (Temporal)   Ht 5' 3"  (1.6 m)   Wt 202 lb 3.2 oz (91.7 kg)   LMP 10/24/2019 (Approximate)   SpO2 98%   BMI 35.82 kg/m   Wt Readings from Last 3 Encounters:  11/21/19 202 lb 3.2 oz (91.7 kg)  07/01/18 181 lb 6.4 oz (82.3 kg)  03/14/18 188 lb (85.3 kg)    Physical Exam Vitals reviewed.  Constitutional:      General: She is not in acute distress.    Appearance: Normal appearance. She is obese. She is not ill-appearing, toxic-appearing or diaphoretic.  HENT:     Head: Normocephalic and atraumatic.     Right Ear: Tympanic membrane, ear canal and external ear normal. There is no  impacted cerumen.     Left Ear: Tympanic membrane, ear canal and external ear normal. There is no impacted cerumen.     Nose: Nose normal. No congestion or rhinorrhea.     Mouth/Throat:     Mouth: Mucous membranes are moist.     Pharynx: Oropharynx is clear. No oropharyngeal exudate or posterior oropharyngeal erythema.  Eyes:     General: No scleral icterus.       Right eye: No discharge.        Left eye: No discharge.     Conjunctiva/sclera: Conjunctivae normal.     Pupils: Pupils are equal, round, and reactive to light.  Cardiovascular:     Rate and Rhythm: Normal rate and regular rhythm.  Heart sounds: Normal heart sounds. No murmur. No friction rub. No gallop.   Pulmonary:     Effort: Pulmonary effort is normal. No respiratory distress.     Breath sounds: Normal breath sounds. No stridor. No wheezing, rhonchi or rales.  Abdominal:     General: Abdomen is flat. Bowel sounds are normal. There is no distension.     Palpations: Abdomen is soft. There is no mass.     Tenderness: There is no abdominal tenderness. There is no guarding or rebound.     Hernia: No hernia is present.  Musculoskeletal:        General: Normal range of motion.     Cervical back: Normal range of motion and neck supple. No rigidity. No muscular tenderness.  Lymphadenopathy:     Cervical: No cervical adenopathy.  Skin:    General: Skin is warm and dry.     Capillary Refill: Capillary refill takes less than 2 seconds.  Neurological:     General: No focal deficit present.     Mental Status: She is alert and oriented to person, place, and time. Mental status is at baseline.  Psychiatric:        Mood and Affect: Mood normal.        Behavior: Behavior normal.        Thought Content: Thought content normal.        Judgment: Judgment normal.     Lab Results  Component Value Date   TSH 1.180 12/10/2017   Lab Results  Component Value Date   WBC 4.2 12/10/2017   HGB 11.1 12/10/2017   HCT 34.9 12/10/2017    MCV 87 12/10/2017   PLT 316 12/10/2017   Lab Results  Component Value Date   NA 142 12/10/2017   K 4.8 12/10/2017   CO2 22 12/10/2017   GLUCOSE 71 12/10/2017   BUN 15 12/10/2017   CREATININE 0.70 12/10/2017   BILITOT <0.2 12/10/2017   ALKPHOS 72 12/10/2017   AST 19 12/10/2017   ALT 23 12/10/2017   PROT 6.9 12/10/2017   ALBUMIN 4.1 12/10/2017   CALCIUM 9.4 12/10/2017   Lab Results  Component Value Date   CHOL 170 12/10/2017   Lab Results  Component Value Date   HDL 67 12/10/2017   Lab Results  Component Value Date   LDLCALC 96 12/10/2017   Lab Results  Component Value Date   TRIG 35 12/10/2017   Lab Results  Component Value Date   CHOLHDL 2.5 12/10/2017   No results found for: HGBA1C

## 2019-11-21 ENCOUNTER — Encounter: Payer: Self-pay | Admitting: Family Medicine

## 2019-11-21 ENCOUNTER — Ambulatory Visit (INDEPENDENT_AMBULATORY_CARE_PROVIDER_SITE_OTHER): Payer: BC Managed Care – PPO | Admitting: Family Medicine

## 2019-11-21 VITALS — BP 112/69 | HR 83 | Temp 98.4°F | Ht 63.0 in | Wt 202.2 lb

## 2019-11-21 DIAGNOSIS — Z833 Family history of diabetes mellitus: Secondary | ICD-10-CM

## 2019-11-21 DIAGNOSIS — Z0000 Encounter for general adult medical examination without abnormal findings: Secondary | ICD-10-CM | POA: Diagnosis not present

## 2019-11-21 DIAGNOSIS — R635 Abnormal weight gain: Secondary | ICD-10-CM

## 2019-11-21 DIAGNOSIS — Z8349 Family history of other endocrine, nutritional and metabolic diseases: Secondary | ICD-10-CM | POA: Diagnosis not present

## 2019-11-21 DIAGNOSIS — J302 Other seasonal allergic rhinitis: Secondary | ICD-10-CM

## 2019-11-21 LAB — BAYER DCA HB A1C WAIVED: HB A1C (BAYER DCA - WAIVED): 5.1 % (ref <7.0)

## 2019-11-21 NOTE — Patient Instructions (Signed)
Preventive Care 71-44 Years Old, Female Preventive care refers to visits with your health care provider and lifestyle choices that can promote health and wellness. This includes:  A yearly physical exam. This may also be called an annual well check.  Regular dental visits and eye exams.  Immunizations.  Screening for certain conditions.  Healthy lifestyle choices, such as eating a healthy diet, getting regular exercise, not using drugs or products that contain nicotine and tobacco, and limiting alcohol use. What can I expect for my preventive care visit? Physical exam Your health care provider will check your:  Height and weight. This may be used to calculate body mass index (BMI), which tells if you are at a healthy weight.  Heart rate and blood pressure.  Skin for abnormal spots. Counseling Your health care provider may ask you questions about your:  Alcohol, tobacco, and drug use.  Emotional well-being.  Home and relationship well-being.  Sexual activity.  Eating habits.  Work and work Statistician.  Method of birth control.  Menstrual cycle.  Pregnancy history. What immunizations do I need?  Influenza (flu) vaccine  This is recommended every year. Tetanus, diphtheria, and pertussis (Tdap) vaccine  You may need a Td booster every 10 years. Varicella (chickenpox) vaccine  You may need this if you have not been vaccinated. Zoster (shingles) vaccine  You may need this after age 67. Measles, mumps, and rubella (MMR) vaccine  You may need at least one dose of MMR if you were born in 1957 or later. You may also need a second dose. Pneumococcal conjugate (PCV13) vaccine  You may need this if you have certain conditions and were not previously vaccinated. Pneumococcal polysaccharide (PPSV23) vaccine  You may need one or two doses if you smoke cigarettes or if you have certain conditions. Meningococcal conjugate (MenACWY) vaccine  You may need this if you  have certain conditions. Hepatitis A vaccine  You may need this if you have certain conditions or if you travel or work in places where you may be exposed to hepatitis A. Hepatitis B vaccine  You may need this if you have certain conditions or if you travel or work in places where you may be exposed to hepatitis B. Haemophilus influenzae type b (Hib) vaccine  You may need this if you have certain conditions. Human papillomavirus (HPV) vaccine  If recommended by your health care provider, you may need three doses over 6 months. You may receive vaccines as individual doses or as more than one vaccine together in one shot (combination vaccines). Talk with your health care provider about the risks and benefits of combination vaccines. What tests do I need? Blood tests  Lipid and cholesterol levels. These may be checked every 5 years, or more frequently if you are over 49 years old.  Hepatitis C test.  Hepatitis B test. Screening  Lung cancer screening. You may have this screening every year starting at age 65 if you have a 30-pack-year history of smoking and currently smoke or have quit within the past 15 years.  Colorectal cancer screening. All adults should have this screening starting at age 4 and continuing until age 28. Your health care provider may recommend screening at age 40 if you are at increased risk. You will have tests every 1-10 years, depending on your results and the type of screening test.  Diabetes screening. This is done by checking your blood sugar (glucose) after you have not eaten for a while (fasting). You may have this  done every 1-3 years.  Mammogram. This may be done every 1-2 years. Talk with your health care provider about when you should start having regular mammograms. This may depend on whether you have a family history of breast cancer.  BRCA-related cancer screening. This may be done if you have a family history of breast, ovarian, tubal, or peritoneal  cancers.  Pelvic exam and Pap test. This may be done every 3 years starting at age 65. Starting at age 95, this may be done every 5 years if you have a Pap test in combination with an HPV test. Other tests  Sexually transmitted disease (STD) testing.  Bone density scan. This is done to screen for osteoporosis. You may have this scan if you are at high risk for osteoporosis. Follow these instructions at home: Eating and drinking  Eat a diet that includes fresh fruits and vegetables, whole grains, lean protein, and low-fat dairy.  Take vitamin and mineral supplements as recommended by your health care provider.  Do not drink alcohol if: ? Your health care provider tells you not to drink. ? You are pregnant, may be pregnant, or are planning to become pregnant.  If you drink alcohol: ? Limit how much you have to 0-1 drink a day. ? Be aware of how much alcohol is in your drink. In the [place] one drink equals one 12 oz bottle of beer (355 mL), one 5 oz glass of wine (148 mL), or one 1 oz glass of hard liquor (44 mL). Lifestyle  Take daily care of your teeth and gums.  Stay active. Exercise for at least 30 minutes on 5 or more days each week.  Do not use any products that contain nicotine or tobacco, such as cigarettes, e-cigarettes, and chewing tobacco. If you need help quitting, ask your health care provider.  If you are sexually active, practice safe sex. Use a condom or other form of birth control (contraception) in order to prevent pregnancy and STIs (sexually transmitted infections).  If told by your health care provider, take low-dose aspirin daily starting at age 60. What's next?  Visit your health care provider once a year for a well check visit.  Ask your health care provider how often you should have your eyes and teeth checked.  Stay up to date on all vaccines. This information is not intended to replace advice given to you by your health care provider. Make sure you  discuss any questions you have with your health care provider. Document Revised: 06/23/2018 Document Reviewed: 06/23/2018 Elsevier Patient Education  2020 [employer].

## 2019-11-22 LAB — CBC WITH DIFFERENTIAL/PLATELET
Basophils Absolute: 0 10*3/uL (ref 0.0–0.2)
Basos: 1 %
EOS (ABSOLUTE): 0.4 10*3/uL (ref 0.0–0.4)
Eos: 8 %
Hematocrit: 37.8 % (ref 34.0–46.6)
Hemoglobin: 12.3 g/dL (ref 11.1–15.9)
Immature Grans (Abs): 0 10*3/uL (ref 0.0–0.1)
Immature Granulocytes: 0 %
Lymphocytes Absolute: 1.9 10*3/uL (ref 0.7–3.1)
Lymphs: 39 %
MCH: 27.4 pg (ref 26.6–33.0)
MCHC: 32.5 g/dL (ref 31.5–35.7)
MCV: 84 fL (ref 79–97)
Monocytes Absolute: 0.5 10*3/uL (ref 0.1–0.9)
Monocytes: 9 %
Neutrophils Absolute: 2.1 10*3/uL (ref 1.4–7.0)
Neutrophils: 43 %
Platelets: 290 10*3/uL (ref 150–450)
RBC: 4.49 x10E6/uL (ref 3.77–5.28)
RDW: 13.2 % (ref 11.7–15.4)
WBC: 4.9 10*3/uL (ref 3.4–10.8)

## 2019-11-22 LAB — LIPID PANEL
Chol/HDL Ratio: 3.3 ratio (ref 0.0–4.4)
Cholesterol, Total: 193 mg/dL (ref 100–199)
HDL: 59 mg/dL (ref >39)
LDL Chol Calc (NIH): 116 mg/dL — ABNORMAL HIGH (ref 0–99)
Triglycerides: 100 mg/dL (ref 0–149)
VLDL Cholesterol Cal: 18 mg/dL (ref 5–40)

## 2019-11-22 LAB — CMP14+EGFR
ALT: 19 IU/L (ref 0–32)
AST: 20 IU/L (ref 0–40)
Albumin/Globulin Ratio: 1.6 (ref 1.2–2.2)
Albumin: 4.2 g/dL (ref 3.8–4.8)
Alkaline Phosphatase: 77 IU/L (ref 39–117)
BUN/Creatinine Ratio: 19 (ref 9–23)
BUN: 14 mg/dL (ref 6–24)
Bilirubin Total: <0.2 mg/dL (ref 0.0–1.2)
CO2: 23 mmol/L (ref 20–29)
Calcium: 9.2 mg/dL (ref 8.7–10.2)
Chloride: 105 mmol/L (ref 96–106)
Creatinine, Ser: 0.72 mg/dL (ref 0.57–1.00)
GFR calc Af Amer: 118 mL/min/{1.73_m2} (ref >59)
GFR calc non Af Amer: 102 mL/min/{1.73_m2} (ref >59)
Globulin, Total: 2.7 g/dL (ref 1.5–4.5)
Glucose: 81 mg/dL (ref 65–99)
Potassium: 4.1 mmol/L (ref 3.5–5.2)
Sodium: 140 mmol/L (ref 134–144)
Total Protein: 6.9 g/dL (ref 6.0–8.5)

## 2019-11-22 LAB — THYROID PANEL WITH TSH
Free Thyroxine Index: 1.3 (ref 1.2–4.9)
T3 Uptake Ratio: 23 % — ABNORMAL LOW (ref 24–39)
T4, Total: 5.5 ug/dL (ref 4.5–12.0)
TSH: 1.37 u[IU]/mL (ref 0.450–4.500)

## 2019-12-06 DIAGNOSIS — M62830 Muscle spasm of back: Secondary | ICD-10-CM | POA: Diagnosis not present

## 2019-12-06 DIAGNOSIS — M9903 Segmental and somatic dysfunction of lumbar region: Secondary | ICD-10-CM | POA: Diagnosis not present

## 2019-12-06 DIAGNOSIS — M9901 Segmental and somatic dysfunction of cervical region: Secondary | ICD-10-CM | POA: Diagnosis not present

## 2019-12-06 DIAGNOSIS — M9902 Segmental and somatic dysfunction of thoracic region: Secondary | ICD-10-CM | POA: Diagnosis not present

## 2019-12-07 DIAGNOSIS — M9903 Segmental and somatic dysfunction of lumbar region: Secondary | ICD-10-CM | POA: Diagnosis not present

## 2019-12-07 DIAGNOSIS — M62830 Muscle spasm of back: Secondary | ICD-10-CM | POA: Diagnosis not present

## 2019-12-07 DIAGNOSIS — M9901 Segmental and somatic dysfunction of cervical region: Secondary | ICD-10-CM | POA: Diagnosis not present

## 2019-12-07 DIAGNOSIS — M9902 Segmental and somatic dysfunction of thoracic region: Secondary | ICD-10-CM | POA: Diagnosis not present

## 2019-12-11 DIAGNOSIS — M9903 Segmental and somatic dysfunction of lumbar region: Secondary | ICD-10-CM | POA: Diagnosis not present

## 2019-12-11 DIAGNOSIS — M62830 Muscle spasm of back: Secondary | ICD-10-CM | POA: Diagnosis not present

## 2019-12-11 DIAGNOSIS — M9902 Segmental and somatic dysfunction of thoracic region: Secondary | ICD-10-CM | POA: Diagnosis not present

## 2019-12-11 DIAGNOSIS — M9901 Segmental and somatic dysfunction of cervical region: Secondary | ICD-10-CM | POA: Diagnosis not present

## 2019-12-13 DIAGNOSIS — M62830 Muscle spasm of back: Secondary | ICD-10-CM | POA: Diagnosis not present

## 2019-12-13 DIAGNOSIS — M9903 Segmental and somatic dysfunction of lumbar region: Secondary | ICD-10-CM | POA: Diagnosis not present

## 2019-12-13 DIAGNOSIS — M9902 Segmental and somatic dysfunction of thoracic region: Secondary | ICD-10-CM | POA: Diagnosis not present

## 2019-12-13 DIAGNOSIS — M9901 Segmental and somatic dysfunction of cervical region: Secondary | ICD-10-CM | POA: Diagnosis not present

## 2020-01-03 DIAGNOSIS — M9901 Segmental and somatic dysfunction of cervical region: Secondary | ICD-10-CM | POA: Diagnosis not present

## 2020-01-03 DIAGNOSIS — M9902 Segmental and somatic dysfunction of thoracic region: Secondary | ICD-10-CM | POA: Diagnosis not present

## 2020-01-03 DIAGNOSIS — M9903 Segmental and somatic dysfunction of lumbar region: Secondary | ICD-10-CM | POA: Diagnosis not present

## 2020-01-03 DIAGNOSIS — M62830 Muscle spasm of back: Secondary | ICD-10-CM | POA: Diagnosis not present

## 2020-01-08 DIAGNOSIS — M9901 Segmental and somatic dysfunction of cervical region: Secondary | ICD-10-CM | POA: Diagnosis not present

## 2020-01-08 DIAGNOSIS — M9903 Segmental and somatic dysfunction of lumbar region: Secondary | ICD-10-CM | POA: Diagnosis not present

## 2020-01-08 DIAGNOSIS — M9902 Segmental and somatic dysfunction of thoracic region: Secondary | ICD-10-CM | POA: Diagnosis not present

## 2020-01-08 DIAGNOSIS — M62830 Muscle spasm of back: Secondary | ICD-10-CM | POA: Diagnosis not present

## 2020-01-10 DIAGNOSIS — M9902 Segmental and somatic dysfunction of thoracic region: Secondary | ICD-10-CM | POA: Diagnosis not present

## 2020-01-10 DIAGNOSIS — M9901 Segmental and somatic dysfunction of cervical region: Secondary | ICD-10-CM | POA: Diagnosis not present

## 2020-01-10 DIAGNOSIS — M62830 Muscle spasm of back: Secondary | ICD-10-CM | POA: Diagnosis not present

## 2020-01-10 DIAGNOSIS — M9903 Segmental and somatic dysfunction of lumbar region: Secondary | ICD-10-CM | POA: Diagnosis not present

## 2020-01-15 DIAGNOSIS — M9902 Segmental and somatic dysfunction of thoracic region: Secondary | ICD-10-CM | POA: Diagnosis not present

## 2020-01-15 DIAGNOSIS — M62830 Muscle spasm of back: Secondary | ICD-10-CM | POA: Diagnosis not present

## 2020-01-15 DIAGNOSIS — M9903 Segmental and somatic dysfunction of lumbar region: Secondary | ICD-10-CM | POA: Diagnosis not present

## 2020-01-15 DIAGNOSIS — M9901 Segmental and somatic dysfunction of cervical region: Secondary | ICD-10-CM | POA: Diagnosis not present

## 2020-01-19 ENCOUNTER — Ambulatory Visit: Payer: Medicaid Other | Attending: Internal Medicine

## 2020-01-19 DIAGNOSIS — Z23 Encounter for immunization: Secondary | ICD-10-CM

## 2020-01-19 DIAGNOSIS — H5213 Myopia, bilateral: Secondary | ICD-10-CM | POA: Diagnosis not present

## 2020-01-19 DIAGNOSIS — H524 Presbyopia: Secondary | ICD-10-CM | POA: Diagnosis not present

## 2020-01-19 NOTE — Progress Notes (Signed)
   Covid-19 Vaccination Clinic  Name:  Tanya Mccoy    MRN: [id number] DOB: 12-Dec-1975  01/19/2020  [name] was observed post Covid-19 immunization for 15 minutes without incident. She was provided with Vaccine Information Sheet and instruction to access the V-Safe system.   Ms. Ramp was instructed to call 911 with any severe reactions post vaccine: Marland Kitchen Difficulty breathing  . Swelling of face and throat  . A fast heartbeat  . A bad rash all over body  . Dizziness and weakness   Immunizations Administered    Name Date Dose VIS Date Route   Moderna COVID-19 Vaccine 01/19/2020  8:29 AM 0.5 mL 09/26/2019 Intramuscular   Manufacturer: Moderna   Lot: [id number]   [place]: [phone]

## 2020-01-22 DIAGNOSIS — H5213 Myopia, bilateral: Secondary | ICD-10-CM | POA: Diagnosis not present

## 2020-02-19 DIAGNOSIS — H524 Presbyopia: Secondary | ICD-10-CM | POA: Diagnosis not present

## 2020-02-19 DIAGNOSIS — H52221 Regular astigmatism, right eye: Secondary | ICD-10-CM | POA: Diagnosis not present

## 2020-02-21 ENCOUNTER — Ambulatory Visit: Payer: Medicaid Other | Attending: Internal Medicine

## 2020-02-21 ENCOUNTER — Other Ambulatory Visit: Payer: Self-pay

## 2020-02-21 DIAGNOSIS — Z23 Encounter for immunization: Secondary | ICD-10-CM

## 2020-02-21 NOTE — Progress Notes (Signed)
   Covid-19 Vaccination Clinic  Name:  Tanya Mccoy    MRN: [id number] DOB: 08/09/1976  02/21/2020  Tanya Mccoy was observed post Covid-19 immunization for 15 minutes without incident. She was provided with Vaccine Information Sheet and instruction to access the V-Safe system.   Tanya Mccoy was instructed to call 911 with any severe reactions post vaccine: Marland Kitchen Difficulty breathing  . Swelling of face and throat  . A fast heartbeat  . A bad rash all over body  . Dizziness and weakness   Immunizations Administered    Name Date Dose VIS Date Route   Moderna COVID-19 Vaccine 02/21/2020  8:42 AM 0.5 mL 09/2019 Intramuscular   Manufacturer: Moderna   Lot: [id number]   [place]: 08138-871-95

## 2020-03-19 ENCOUNTER — Telehealth: Payer: Self-pay | Admitting: Family Medicine

## 2020-03-19 ENCOUNTER — Telehealth (INDEPENDENT_AMBULATORY_CARE_PROVIDER_SITE_OTHER): Payer: Medicaid Other | Admitting: Family Medicine

## 2020-03-19 ENCOUNTER — Encounter: Payer: Self-pay | Admitting: Family Medicine

## 2020-03-19 DIAGNOSIS — L255 Unspecified contact dermatitis due to plants, except food: Secondary | ICD-10-CM | POA: Diagnosis not present

## 2020-03-19 DIAGNOSIS — B372 Candidiasis of skin and nail: Secondary | ICD-10-CM

## 2020-03-19 MED ORDER — NYSTATIN 100000 UNIT/GM EX POWD: 1.0000 "application " | Freq: Two times a day (BID) | CUTANEOUS | 0 refills | Status: DC

## 2020-03-19 MED ORDER — PREDNISONE 10 MG (21) PO TBPK: ORAL_TABLET | ORAL | 0 refills | Status: DC

## 2020-03-19 NOTE — Progress Notes (Signed)
Virtual Visit via Video note  I connected with Tanya Mccoy on 03/19/20 at 12:18 PM by video and verified that I am speaking with the correct person using two identifiers. Tanya Mccoy is currently located in her car and nobody is currently with her during visit. The provider, Loman Brooklyn, FNP is located in their office at time of visit.  I discussed the limitations, risks, security and privacy concerns of performing an evaluation and management service by video and the availability of in person appointments. I also discussed with the patient that there may be a patient responsible charge related to this service. The patient expressed understanding and agreed to proceed.  Subjective: PCP: Loman Brooklyn, FNP  Chief Complaint  Patient presents with  . Rash   Patient reports she was out in the yard this weekend working and got into some poison ivy. It started on right right ring finger but has spread to her face and chest. It is very itchy.   She also has some erythema and irritation under both breasts.   ROS: Per HPI  Current Outpatient Medications:  .  cetirizine (ZYRTEC) 10 MG tablet, TAKE 1 TABLET (10 MG TOTAL) BY MOUTH DAILY., Disp: 30 tablet, Rfl: 5 .  fluticasone (FLONASE) 50 MCG/ACT nasal spray, Place 2 sprays into both nostrils daily., Disp: 16 g, Rfl: 6 .  Multiple Vitamin (MULTIVITAMIN WITH MINERALS) TABS tablet, Take 1 tablet by mouth daily., Disp: , Rfl:   No Known Allergies Past Medical History:  Diagnosis Date  . Allergy     Observations/Objective: Physical Exam Constitutional:      General: She is not in acute distress.    Appearance: Normal appearance. She is not ill-appearing or toxic-appearing.  Eyes:     General: No scleral icterus.       Right eye: No discharge.        Left eye: No discharge.     Conjunctiva/sclera: Conjunctivae normal.  Pulmonary:     Effort: Pulmonary effort is normal. No respiratory distress.    Skin:    Findings: Erythema (under bilateral breasts) and rash (right ring finger, chest, right side of face next to eye - raised, red) present.  Neurological:     Mental Status: She is alert and oriented to person, place, and time.  Psychiatric:        Mood and Affect: Mood normal.        Behavior: Behavior normal.        Thought Content: Thought content normal.        Judgment: Judgment normal.    Assessment and Plan: 1. Candidal intertrigo - nystatin (MYCOSTATIN/NYSTOP) powder; Apply 1 application topically 2 (two) times daily.  Dispense: 30 g; Refill: 0  2. Rhus dermatitis - Zyrtec daily for itching. Cool compresses. Education provided on poison ivy.  - predniSONE (STERAPRED UNI-PAK 21 TAB) 10 MG (21) TBPK tablet; As directed x 6 days  Dispense: 21 tablet; Refill: 0   Follow Up Instructions:   I discussed the assessment and treatment plan with the patient. The patient was provided an opportunity to ask questions and all were answered. The patient agreed with the plan and demonstrated an understanding of the instructions.   The patient was advised to call back or seek an in-person evaluation if the symptoms worsen or if the condition fails to improve as anticipated.  The above assessment and management plan was discussed with the patient. The patient verbalized understanding of and has agreed  to the management plan. Patient is aware to call the clinic if symptoms persist or worsen. Patient is aware when to return to the clinic for a follow-up visit. Patient educated on when it is appropriate to go to the emergency department.   Time call ended: 12:25 PM  I provided 9 minutes of face-to-face time during this encounter.   Hendricks Limes, MSN, APRN, FNP-C [facility] 03/19/20

## 2020-03-19 NOTE — Patient Instructions (Signed)
Poison Ivy Dermatitis Poison ivy dermatitis is redness and soreness of the skin caused by chemicals in the leaves of the poison ivy plant. You may have very bad itching, swelling, a rash, and blisters. What are the causes?  Touching a poison ivy plant.  Touching something that has the chemical on it. This may include animals or objects that have come in contact with the plant. What increases the risk?  Going outdoors often in wooded or [place] areas.  Going outdoors without wearing protective clothing, such as closed shoes, long pants, and a long-sleeved shirt. What are the signs or symptoms?   Skin redness.  Very bad itching.  A rash that often includes bumps and blisters. ? The rash usually appears 48 hours after exposure, if you have been exposed before. ? If this is the first time you have been exposed, the rash may not appear until a week after exposure.  Swelling. This may occur if the reaction is very bad. Symptoms usually last for 1-2 weeks. The first time you develop this condition, symptoms may last 3-4 weeks. How is this treated? This condition may be treated with:  Hydrocortisone cream or calamine lotion to relieve itching.  Oatmeal baths to soothe the skin.  Medicines, such as over-the-counter antihistamine tablets.  Oral steroid medicine for more severe reactions. Follow these instructions at home: Medicines  Take or apply over-the-counter and prescription medicines only as told by your doctor.  Use hydrocortisone cream or calamine lotion as needed to help with itching. General instructions  Do not scratch or rub your skin.  Put a cold, wet cloth (cold compress) on the affected areas or take baths in cool water. This will help with itching.  Avoid hot baths and showers.  Take oatmeal baths as needed. Use colloidal oatmeal. You can get this at a pharmacy or grocery store. Follow the instructions on the package.  While you have the rash, wash your clothes  right after you wear them.  Keep all follow-up visits as told by your health care provider. This is important. How is this prevented?   Know what poison ivy looks like, so you can avoid it. ? This plant has three leaves with flowering branches on a single stem. ? The leaves are glossy. ? The leaves have uneven edges that come to a point at the front.  If you touch poison ivy, wash your skin with soap and water right away. Be sure to wash under your fingernails.  When hiking or camping, wear long pants, a long-sleeved shirt, tall socks, and hiking boots. You can also use a lotion on your skin that helps to prevent contact with poison ivy.  If you think that your clothes or outdoor gear came in contact with poison ivy, rinse them off with a garden hose before you bring them inside your house.  When doing yard work or gardening, wear gloves, long sleeves, long pants, and boots. Wash your garden tools and gloves if they come in contact with poison ivy.  If you think that your pet has come into contact with poison ivy, wash him or her with pet shampoo and water. Make sure to wear gloves while washing your pet. Contact a doctor if:  You have open sores in the rash area.  You have more redness, swelling, or pain in the rash area.  You have redness that spreads beyond the rash area.  You have fluid, blood, or pus coming from the rash area.  You have a  fever.  You have a rash over a large area of your body.  You have a rash on your eyes, mouth, or genitals.  Your rash does not get better after a few weeks. Get help right away if:  Your face swells or your eyes swell shut.  You have trouble breathing.  You have trouble swallowing. These symptoms may be an emergency. Do not wait to see if the symptoms will go away. Get medical help right away. Call your local emergency services (911 in the [place] Do not drive yourself to the hospital. Summary  Poison ivy dermatitis is redness and  soreness of the skin caused by chemicals in the leaves of the poison ivy plant.  You may have skin redness, very bad itching, swelling, and a rash.  Do not scratch or rub your skin.  Take or apply over-the-counter and prescription medicines only as told by your doctor. This information is not intended to replace advice given to you by your health care provider. Make sure you discuss any questions you have with your health care provider. Document Revised: 02/03/2019 Document Reviewed: 10/07/2018 Elsevier Patient Education  2020 [employer].

## 2020-04-09 DIAGNOSIS — R5382 Chronic fatigue, unspecified: Secondary | ICD-10-CM | POA: Diagnosis not present

## 2020-04-09 DIAGNOSIS — N951 Menopausal and female climacteric states: Secondary | ICD-10-CM | POA: Diagnosis not present

## 2020-04-09 DIAGNOSIS — E8881 Metabolic syndrome: Secondary | ICD-10-CM | POA: Diagnosis not present

## 2020-04-09 DIAGNOSIS — E559 Vitamin D deficiency, unspecified: Secondary | ICD-10-CM | POA: Diagnosis not present

## 2020-04-09 DIAGNOSIS — Z6836 Body mass index (BMI) 36.0-36.9, adult: Secondary | ICD-10-CM | POA: Diagnosis not present

## 2020-04-17 DIAGNOSIS — Z1339 Encounter for screening examination for other mental health and behavioral disorders: Secondary | ICD-10-CM | POA: Diagnosis not present

## 2020-04-17 DIAGNOSIS — Z6837 Body mass index (BMI) 37.0-37.9, adult: Secondary | ICD-10-CM | POA: Diagnosis not present

## 2020-04-17 DIAGNOSIS — E559 Vitamin D deficiency, unspecified: Secondary | ICD-10-CM | POA: Diagnosis not present

## 2020-04-17 DIAGNOSIS — E8881 Metabolic syndrome: Secondary | ICD-10-CM | POA: Diagnosis not present

## 2020-04-17 DIAGNOSIS — N951 Menopausal and female climacteric states: Secondary | ICD-10-CM | POA: Diagnosis not present

## 2020-04-17 DIAGNOSIS — E7800 Pure hypercholesterolemia, unspecified: Secondary | ICD-10-CM | POA: Diagnosis not present

## 2020-04-17 DIAGNOSIS — Z1331 Encounter for screening for depression: Secondary | ICD-10-CM | POA: Diagnosis not present

## 2020-04-17 DIAGNOSIS — R5382 Chronic fatigue, unspecified: Secondary | ICD-10-CM | POA: Diagnosis not present

## 2020-05-20 DIAGNOSIS — E8881 Metabolic syndrome: Secondary | ICD-10-CM | POA: Diagnosis not present

## 2020-05-20 DIAGNOSIS — Z6836 Body mass index (BMI) 36.0-36.9, adult: Secondary | ICD-10-CM | POA: Diagnosis not present

## 2020-05-20 DIAGNOSIS — E7800 Pure hypercholesterolemia, unspecified: Secondary | ICD-10-CM | POA: Diagnosis not present

## 2020-05-27 DIAGNOSIS — Z6835 Body mass index (BMI) 35.0-35.9, adult: Secondary | ICD-10-CM | POA: Diagnosis not present

## 2020-05-27 DIAGNOSIS — E7800 Pure hypercholesterolemia, unspecified: Secondary | ICD-10-CM | POA: Diagnosis not present

## 2020-06-04 DIAGNOSIS — Z6835 Body mass index (BMI) 35.0-35.9, adult: Secondary | ICD-10-CM | POA: Diagnosis not present

## 2020-06-04 DIAGNOSIS — E8881 Metabolic syndrome: Secondary | ICD-10-CM | POA: Diagnosis not present

## 2020-06-10 DIAGNOSIS — E7800 Pure hypercholesterolemia, unspecified: Secondary | ICD-10-CM | POA: Diagnosis not present

## 2020-06-10 DIAGNOSIS — Z6835 Body mass index (BMI) 35.0-35.9, adult: Secondary | ICD-10-CM | POA: Diagnosis not present

## 2020-06-10 DIAGNOSIS — E8881 Metabolic syndrome: Secondary | ICD-10-CM | POA: Diagnosis not present

## 2020-06-17 DIAGNOSIS — E7800 Pure hypercholesterolemia, unspecified: Secondary | ICD-10-CM | POA: Diagnosis not present

## 2020-07-08 DIAGNOSIS — E8881 Metabolic syndrome: Secondary | ICD-10-CM | POA: Diagnosis not present

## 2020-07-08 DIAGNOSIS — Z6835 Body mass index (BMI) 35.0-35.9, adult: Secondary | ICD-10-CM | POA: Diagnosis not present

## 2020-07-15 DIAGNOSIS — Z6835 Body mass index (BMI) 35.0-35.9, adult: Secondary | ICD-10-CM | POA: Diagnosis not present

## 2020-07-15 DIAGNOSIS — M9901 Segmental and somatic dysfunction of cervical region: Secondary | ICD-10-CM | POA: Diagnosis not present

## 2020-07-15 DIAGNOSIS — M9902 Segmental and somatic dysfunction of thoracic region: Secondary | ICD-10-CM | POA: Diagnosis not present

## 2020-07-15 DIAGNOSIS — E7800 Pure hypercholesterolemia, unspecified: Secondary | ICD-10-CM | POA: Diagnosis not present

## 2020-07-15 DIAGNOSIS — M9903 Segmental and somatic dysfunction of lumbar region: Secondary | ICD-10-CM | POA: Diagnosis not present

## 2020-07-15 DIAGNOSIS — M62830 Muscle spasm of back: Secondary | ICD-10-CM | POA: Diagnosis not present

## 2020-07-22 DIAGNOSIS — E559 Vitamin D deficiency, unspecified: Secondary | ICD-10-CM | POA: Diagnosis not present

## 2020-07-22 DIAGNOSIS — Z6834 Body mass index (BMI) 34.0-34.9, adult: Secondary | ICD-10-CM | POA: Diagnosis not present

## 2020-07-29 DIAGNOSIS — M9901 Segmental and somatic dysfunction of cervical region: Secondary | ICD-10-CM | POA: Diagnosis not present

## 2020-07-29 DIAGNOSIS — M9903 Segmental and somatic dysfunction of lumbar region: Secondary | ICD-10-CM | POA: Diagnosis not present

## 2020-07-29 DIAGNOSIS — M9902 Segmental and somatic dysfunction of thoracic region: Secondary | ICD-10-CM | POA: Diagnosis not present

## 2020-07-29 DIAGNOSIS — M62830 Muscle spasm of back: Secondary | ICD-10-CM | POA: Diagnosis not present

## 2020-08-12 DIAGNOSIS — M9901 Segmental and somatic dysfunction of cervical region: Secondary | ICD-10-CM | POA: Diagnosis not present

## 2020-08-12 DIAGNOSIS — M9903 Segmental and somatic dysfunction of lumbar region: Secondary | ICD-10-CM | POA: Diagnosis not present

## 2020-08-12 DIAGNOSIS — M9902 Segmental and somatic dysfunction of thoracic region: Secondary | ICD-10-CM | POA: Diagnosis not present

## 2020-08-12 DIAGNOSIS — M62830 Muscle spasm of back: Secondary | ICD-10-CM | POA: Diagnosis not present

## 2020-08-26 DIAGNOSIS — M9903 Segmental and somatic dysfunction of lumbar region: Secondary | ICD-10-CM | POA: Diagnosis not present

## 2020-08-26 DIAGNOSIS — M9901 Segmental and somatic dysfunction of cervical region: Secondary | ICD-10-CM | POA: Diagnosis not present

## 2020-08-26 DIAGNOSIS — M9902 Segmental and somatic dysfunction of thoracic region: Secondary | ICD-10-CM | POA: Diagnosis not present

## 2020-08-26 DIAGNOSIS — M62830 Muscle spasm of back: Secondary | ICD-10-CM | POA: Diagnosis not present

## 2020-08-27 ENCOUNTER — Other Ambulatory Visit: Payer: Self-pay | Admitting: Family Medicine

## 2020-08-27 DIAGNOSIS — Z1231 Encounter for screening mammogram for malignant neoplasm of breast: Secondary | ICD-10-CM

## 2020-09-03 ENCOUNTER — Ambulatory Visit (INDEPENDENT_AMBULATORY_CARE_PROVIDER_SITE_OTHER): Payer: Medicaid Other | Admitting: Nurse Practitioner

## 2020-09-03 ENCOUNTER — Encounter: Payer: Self-pay | Admitting: Nurse Practitioner

## 2020-09-03 DIAGNOSIS — J00 Acute nasopharyngitis [common cold]: Secondary | ICD-10-CM

## 2020-09-03 MED ORDER — FLUTICASONE PROPIONATE 50 MCG/ACT NA SUSP: 2.0000 | Freq: Every day | NASAL | 6 refills | Status: DC

## 2020-09-03 NOTE — Progress Notes (Signed)
Virtual Visit via telephone Note Due to COVID-19 pandemic this visit was conducted virtually. This visit type was conducted due to national recommendations for restrictions regarding the COVID-19 Pandemic (e.g. social distancing, sheltering in place) in an effort to limit this patient's exposure and mitigate transmission in our community. All issues noted in this document were discussed and addressed.  A physical exam was not performed with this format.  I connected with Tanya Mccoy on 09/03/20 at 1:40 by telephone and verified that I am speaking with the correct person using two identifiers. Tanya Mccoy is currently located at home and no one is currently with  her during visit. The provider, Mary-Margaret Hassell Done, FNP is located in their office at time of visit.  I discussed the limitations, risks, security and privacy concerns of performing an evaluation and management service by telephone and the availability of in person appointments. I also discussed with the patient that there may be a patient responsible charge related to this service. The patient expressed understanding and agreed to proceed.   History and Present Illness:   Chief Complaint: Sinusitis   HPI Patient calls in for telephone visit. C/o cough, congestion and runny nose . Started on Sunday. Denies fever, body aches or loss of taste or smell.  She has had both covid vaccine and has not been exposed that she is aware of.    Review of Systems  Constitutional: Negative for chills, diaphoresis, fever, malaise/fatigue and weight loss.  HENT: Positive for congestion. Negative for sinus pain and sore throat.   Eyes: Negative for blurred vision, double vision and pain.  Respiratory: Positive for cough (productive at times). Negative for shortness of breath.   Cardiovascular: Negative for chest pain, palpitations, orthopnea and leg swelling.  Gastrointestinal: Negative for abdominal pain.  Skin:  Negative for rash.  Neurological: Negative for dizziness, sensory change, loss of consciousness, weakness and headaches.  Endo/Heme/Allergies: Negative for polydipsia. Does not bruise/bleed easily.  Psychiatric/Behavioral: Negative.  Negative for memory loss. The patient does not have insomnia.   All other systems reviewed and are negative.    Observations/Objective: Alert and oriented- answers all questions appropriately No distress Voice hoarse   Assessment and Plan: Tanya Mccoy in today with chief complaint of Sinusitis   1. Acute nasopharyngitis 1. Take meds as prescribed 2. Use a cool mist humidifier especially during the winter months and when heat has been humid. 3. Use saline nose sprays frequently 4. Saline irrigations of the nose can be very helpful if done frequently.  * 4X daily for 1 week*  * Use of a nettie pot can be helpful with this. Follow directions with this* 5. Drink plenty of fluids 6. Keep thermostat turn down low 7.For any cough or congestion  Use plain Mucinex- regular strength or max strength is fine   * Children- consult with Pharmacist for dosing 8. For fever or aces or pains- take tylenol or ibuprofen appropriate for age and weight.  * for fevers greater than 101 orally you may alternate ibuprofen and tylenol every  3 hours.   Meds ordered this encounter  Medications  . fluticasone (FLONASE) 50 MCG/ACT nasal spray    Sig: Place 2 sprays into both nostrils daily.    Dispense:  16 g    Refill:  6    Order Specific Question:   Supervising Provider    Answer:   Caryl Pina A [[id number]]        Follow Up Instructions: prn  I discussed the assessment and treatment plan with the patient. The patient was provided an opportunity to ask questions and all were answered. The patient agreed with the plan and demonstrated an understanding of the instructions.   The patient was advised to call back or seek an in-person evaluation  if the symptoms worsen or if the condition fails to improve as anticipated.  The above assessment and management plan was discussed with the patient. The patient verbalized understanding of and has agreed to the management plan. Patient is aware to call the clinic if symptoms persist or worsen. Patient is aware when to return to the clinic for a follow-up visit. Patient educated on when it is appropriate to go to the emergency department.   Time call ended:  1:54  I provided 14 minutes of non-face-to-face time during this encounter.    Mary-Margaret Hassell Done, FNP

## 2020-09-09 DIAGNOSIS — M62830 Muscle spasm of back: Secondary | ICD-10-CM | POA: Diagnosis not present

## 2020-09-09 DIAGNOSIS — M9903 Segmental and somatic dysfunction of lumbar region: Secondary | ICD-10-CM | POA: Diagnosis not present

## 2020-09-09 DIAGNOSIS — M9902 Segmental and somatic dysfunction of thoracic region: Secondary | ICD-10-CM | POA: Diagnosis not present

## 2020-09-09 DIAGNOSIS — M9901 Segmental and somatic dysfunction of cervical region: Secondary | ICD-10-CM | POA: Diagnosis not present

## 2020-09-23 ENCOUNTER — Other Ambulatory Visit: Payer: Self-pay

## 2020-09-23 ENCOUNTER — Ambulatory Visit
Admission: RE | Admit: 2020-09-23 | Discharge: 2020-09-23 | Disposition: A | Discharge status: Home / Self Care | Payer: Medicaid Other | Source: Ambulatory Visit | Attending: Family Medicine | Admitting: Family Medicine

## 2020-09-23 DIAGNOSIS — M9902 Segmental and somatic dysfunction of thoracic region: Secondary | ICD-10-CM | POA: Diagnosis not present

## 2020-09-23 DIAGNOSIS — M62830 Muscle spasm of back: Secondary | ICD-10-CM | POA: Diagnosis not present

## 2020-09-23 DIAGNOSIS — M9901 Segmental and somatic dysfunction of cervical region: Secondary | ICD-10-CM | POA: Diagnosis not present

## 2020-09-23 DIAGNOSIS — Z1231 Encounter for screening mammogram for malignant neoplasm of breast: Secondary | ICD-10-CM

## 2020-09-23 DIAGNOSIS — M9903 Segmental and somatic dysfunction of lumbar region: Secondary | ICD-10-CM | POA: Diagnosis not present

## 2020-10-07 DIAGNOSIS — M9902 Segmental and somatic dysfunction of thoracic region: Secondary | ICD-10-CM | POA: Diagnosis not present

## 2020-10-07 DIAGNOSIS — M9903 Segmental and somatic dysfunction of lumbar region: Secondary | ICD-10-CM | POA: Diagnosis not present

## 2020-10-07 DIAGNOSIS — M62830 Muscle spasm of back: Secondary | ICD-10-CM | POA: Diagnosis not present

## 2020-10-07 DIAGNOSIS — M9901 Segmental and somatic dysfunction of cervical region: Secondary | ICD-10-CM | POA: Diagnosis not present

## 2020-10-31 DIAGNOSIS — M62830 Muscle spasm of back: Secondary | ICD-10-CM | POA: Diagnosis not present

## 2020-10-31 DIAGNOSIS — M9902 Segmental and somatic dysfunction of thoracic region: Secondary | ICD-10-CM | POA: Diagnosis not present

## 2020-10-31 DIAGNOSIS — M9901 Segmental and somatic dysfunction of cervical region: Secondary | ICD-10-CM | POA: Diagnosis not present

## 2020-10-31 DIAGNOSIS — M9903 Segmental and somatic dysfunction of lumbar region: Secondary | ICD-10-CM | POA: Diagnosis not present

## 2020-11-13 DIAGNOSIS — M9901 Segmental and somatic dysfunction of cervical region: Secondary | ICD-10-CM | POA: Diagnosis not present

## 2020-11-13 DIAGNOSIS — M9903 Segmental and somatic dysfunction of lumbar region: Secondary | ICD-10-CM | POA: Diagnosis not present

## 2020-11-13 DIAGNOSIS — M9902 Segmental and somatic dysfunction of thoracic region: Secondary | ICD-10-CM | POA: Diagnosis not present

## 2020-11-13 DIAGNOSIS — M62830 Muscle spasm of back: Secondary | ICD-10-CM | POA: Diagnosis not present

## 2020-11-21 ENCOUNTER — Encounter: Payer: Medicaid Other | Admitting: Family Medicine

## 2020-11-28 DIAGNOSIS — M9901 Segmental and somatic dysfunction of cervical region: Secondary | ICD-10-CM | POA: Diagnosis not present

## 2020-11-28 DIAGNOSIS — M9902 Segmental and somatic dysfunction of thoracic region: Secondary | ICD-10-CM | POA: Diagnosis not present

## 2020-11-28 DIAGNOSIS — M9903 Segmental and somatic dysfunction of lumbar region: Secondary | ICD-10-CM | POA: Diagnosis not present

## 2020-11-28 DIAGNOSIS — M62830 Muscle spasm of back: Secondary | ICD-10-CM | POA: Diagnosis not present

## 2020-12-26 ENCOUNTER — Other Ambulatory Visit: Payer: Self-pay

## 2020-12-26 ENCOUNTER — Ambulatory Visit: Payer: BC Managed Care – PPO | Admitting: Family Medicine

## 2020-12-26 ENCOUNTER — Encounter: Payer: Self-pay | Admitting: Family Medicine

## 2020-12-26 VITALS — BP 112/62 | HR 91 | Temp 97.8°F | Ht 63.0 in | Wt 202.6 lb

## 2020-12-26 DIAGNOSIS — R102 Pelvic and perineal pain: Secondary | ICD-10-CM | POA: Diagnosis not present

## 2020-12-26 NOTE — Progress Notes (Signed)
Assessment & Plan:  1. Pelvic pain Encouraged routine Ibuprofen for pain. - US Pelvic Complete With Transvaginal; Future   Follow up plan: Return if symptoms worsen or fail to improve.  Hendricks Limes, MSN, APRN, FNP-C [facility]  Subjective:   Patient ID: Tanya Mccoy, female    DOB: [birth year], 45 y.o.   MRN: [id number]  HPI: Tanya Mccoy is a 45 y.o. female presenting on 12/26/2020 for Pelvic Pain and Back Pain  Patient complains of pelvic pain x1 month.  She describes it as a cramping sensation.  She denies any strenuous activity.  Denies any urinary symptoms.  Reports she does sometimes get nauseated the pain gets so bad.  She does have a history of ovarian cyst.  She is taking aspirin or pain) for the pain.  Her low back does have some degree of pain, but she is more worried about her pelvic pain.   ROS: Negative unless specifically indicated above in HPI.   Relevant past medical history reviewed and updated as indicated.   Allergies and medications reviewed and updated.   Current Outpatient Medications:  .  cetirizine (ZYRTEC) 10 MG tablet, TAKE 1 TABLET (10 MG TOTAL) BY MOUTH DAILY., Disp: 30 tablet, Rfl: 5 .  fluticasone (FLONASE) 50 MCG/ACT nasal spray, Place 2 sprays into both nostrils daily., Disp: 16 g, Rfl: 6 .  Multiple Vitamin (MULTIVITAMIN WITH MINERALS) TABS tablet, Take 1 tablet by mouth daily., Disp: , Rfl:   No Known Allergies  Objective:   BP 112/62   Pulse 91   Temp 97.8 F (36.6 C)   Ht 5' 3"  (1.6 m)   Wt 202 lb 9.6 oz (91.9 kg)   LMP 12/19/2020   SpO2 96%   BMI 35.89 kg/m    Physical Exam Vitals reviewed.  Constitutional:      General: She is not in acute distress.    Appearance: Normal appearance. She is obese. She is not ill-appearing, toxic-appearing or diaphoretic.  HENT:     Head: Normocephalic and atraumatic.  Eyes:     General: No scleral icterus.       Right eye: No discharge.         Left eye: No discharge.     Conjunctiva/sclera: Conjunctivae normal.  Cardiovascular:     Rate and Rhythm: Normal rate.  Pulmonary:     Effort: Pulmonary effort is normal. No respiratory distress.  Abdominal:     General: Abdomen is flat. Bowel sounds are normal.     Palpations: Abdomen is soft. There is no shifting dullness, fluid wave, hepatomegaly, splenomegaly, mass or pulsatile mass.     Tenderness: There is abdominal tenderness in the right lower quadrant and left lower quadrant.  Musculoskeletal:        General: Normal range of motion.     Cervical back: Normal range of motion.  Skin:    General: Skin is warm and dry.     Capillary Refill: Capillary refill takes less than 2 seconds.  Neurological:     General: No focal deficit present.     Mental Status: She is alert and oriented to person, place, and time. Mental status is at baseline.  Psychiatric:        Mood and Affect: Mood normal.        Behavior: Behavior normal.        Thought Content: Thought content normal.        Judgment: Judgment normal.

## 2020-12-29 ENCOUNTER — Encounter: Payer: Self-pay | Admitting: Family Medicine

## 2020-12-31 ENCOUNTER — Telehealth: Payer: Self-pay | Admitting: Family Medicine

## 2021-01-06 ENCOUNTER — Ambulatory Visit (HOSPITAL_COMMUNITY): Payer: Medicaid Other

## 2021-01-10 ENCOUNTER — Ambulatory Visit (HOSPITAL_COMMUNITY): Payer: Medicaid Other

## 2021-01-10 ENCOUNTER — Encounter: Payer: Medicaid Other | Admitting: Family Medicine

## 2021-01-15 ENCOUNTER — Ambulatory Visit (HOSPITAL_COMMUNITY)
Admission: RE | Admit: 2021-01-15 | Discharge: 2021-01-15 | Disposition: A | Discharge status: Home / Self Care | Payer: BC Managed Care – PPO | Source: Ambulatory Visit | Attending: Family Medicine | Admitting: Family Medicine

## 2021-01-15 ENCOUNTER — Other Ambulatory Visit: Payer: Self-pay

## 2021-01-15 DIAGNOSIS — R102 Pelvic and perineal pain: Secondary | ICD-10-CM | POA: Diagnosis not present

## 2021-02-11 ENCOUNTER — Encounter: Payer: Self-pay | Admitting: Family Medicine

## 2021-02-11 ENCOUNTER — Ambulatory Visit (INDEPENDENT_AMBULATORY_CARE_PROVIDER_SITE_OTHER): Payer: BC Managed Care – PPO | Admitting: Family Medicine

## 2021-02-11 ENCOUNTER — Other Ambulatory Visit: Payer: Self-pay | Admitting: Family Medicine

## 2021-02-11 ENCOUNTER — Other Ambulatory Visit: Payer: Self-pay

## 2021-02-11 VITALS — BP 116/74 | HR 79 | Temp 97.4°F | Ht 63.0 in | Wt 207.8 lb

## 2021-02-11 DIAGNOSIS — Z0001 Encounter for general adult medical examination with abnormal findings: Secondary | ICD-10-CM | POA: Diagnosis not present

## 2021-02-11 DIAGNOSIS — Z0000 Encounter for general adult medical examination without abnormal findings: Secondary | ICD-10-CM

## 2021-02-11 DIAGNOSIS — Z1211 Encounter for screening for malignant neoplasm of colon: Secondary | ICD-10-CM | POA: Diagnosis not present

## 2021-02-11 MED ORDER — SEMAGLUTIDE-WEIGHT MANAGEMENT 0.25 MG/0.5ML ~~LOC~~ SOAJ: 0.2500 mg | SUBCUTANEOUS | 0 refills | Status: DC

## 2021-02-11 MED ORDER — SEMAGLUTIDE-WEIGHT MANAGEMENT 1.7 MG/0.75ML ~~LOC~~ SOAJ: 1.7000 mg | SUBCUTANEOUS | 0 refills | Status: DC

## 2021-02-11 MED ORDER — SEMAGLUTIDE-WEIGHT MANAGEMENT 1 MG/0.5ML ~~LOC~~ SOAJ: 1.0000 mg | SUBCUTANEOUS | 0 refills | Status: DC

## 2021-02-11 MED ORDER — SEMAGLUTIDE-WEIGHT MANAGEMENT 2.4 MG/0.75ML ~~LOC~~ SOAJ: 2.4000 mg | SUBCUTANEOUS | 0 refills | Status: DC

## 2021-02-11 MED ORDER — SEMAGLUTIDE-WEIGHT MANAGEMENT 0.5 MG/0.5ML ~~LOC~~ SOAJ: 0.5000 mg | SUBCUTANEOUS | 0 refills | Status: DC

## 2021-02-11 NOTE — Patient Instructions (Signed)
Preventive Care 84-45 Years Old, Female Preventive care refers to lifestyle choices and visits with your health care provider that can promote health and wellness. This includes:  A yearly physical exam. This is also called an annual wellness visit.  Regular dental and eye exams.  Immunizations.  Screening for certain conditions.  Healthy lifestyle choices, such as: ? Eating a healthy diet. ? Getting regular exercise. ? Not using drugs or products that contain nicotine and tobacco. ? Limiting alcohol use. What can I expect for my preventive care visit? Physical exam Your health care provider will check your:  Height and weight. These may be used to calculate your BMI (body mass index). BMI is a measurement that tells if you are at a healthy weight.  Heart rate and blood pressure.  Body temperature.  Skin for abnormal spots. Counseling Your health care provider may ask you questions about your:  Past medical problems.  Family's medical history.  Alcohol, tobacco, and drug use.  Emotional well-being.  Home life and relationship well-being.  Sexual activity.  Diet, exercise, and sleep habits.  Work and work Statistician.  Access to firearms.  Method of birth control.  Menstrual cycle.  Pregnancy history. What immunizations do I need? Vaccines are usually given at various ages, according to a schedule. Your health care provider will recommend vaccines for you based on your age, medical history, and lifestyle or other factors, such as travel or where you work.   What tests do I need? Blood tests  Lipid and cholesterol levels. These may be checked every 5 years, or more often if you are over 3 years old.  Hepatitis C test.  Hepatitis B test. Screening  Lung cancer screening. You may have this screening every year starting at age 73 if you have a 30-pack-year history of smoking and currently smoke or have quit within the past 15 years.  Colorectal cancer  screening. ? All adults should have this screening starting at age 52 and continuing until age 17. ? Your health care provider may recommend screening at age 49 if you are at increased risk. ? You will have tests every 1-10 years, depending on your results and the type of screening test.  Diabetes screening. ? This is done by checking your blood sugar (glucose) after you have not eaten for a while (fasting). ? You may have this done every 1-3 years.  Mammogram. ? This may be done every 1-2 years. ? Talk with your health care provider about when you should start having regular mammograms. This may depend on whether you have a family history of breast cancer.  BRCA-related cancer screening. This may be done if you have a family history of breast, ovarian, tubal, or peritoneal cancers.  Pelvic exam and Pap test. ? This may be done every 3 years starting at age 10. ? Starting at age 11, this may be done every 5 years if you have a Pap test in combination with an HPV test. Other tests  STD (sexually transmitted disease) testing, if you are at risk.  Bone density scan. This is done to screen for osteoporosis. You may have this scan if you are at high risk for osteoporosis. Talk with your health care provider about your test results, treatment options, and if necessary, the need for more tests. Follow these instructions at home: Eating and drinking  Eat a diet that includes fresh fruits and vegetables, whole grains, lean protein, and low-fat dairy products.  Take vitamin and mineral supplements  as recommended by your health care provider.  Do not drink alcohol if: ? Your health care provider tells you not to drink. ? You are pregnant, may be pregnant, or are planning to become pregnant.  If you drink alcohol: ? Limit how much you have to 0-1 drink a day. ? Be aware of how much alcohol is in your drink. In the [place] one drink equals one 12 oz bottle of beer (355 mL), one 5 oz glass of  wine (148 mL), or one 1 oz glass of hard liquor (44 mL).   Lifestyle  Take daily care of your teeth and gums. Brush your teeth every morning and night with fluoride toothpaste. Floss one time each day.  Stay active. Exercise for at least 30 minutes 5 or more days each week.  Do not use any products that contain nicotine or tobacco, such as cigarettes, e-cigarettes, and chewing tobacco. If you need help quitting, ask your health care provider.  Do not use drugs.  If you are sexually active, practice safe sex. Use a condom or other form of protection to prevent STIs (sexually transmitted infections).  If you do not wish to become pregnant, use a form of birth control. If you plan to become pregnant, see your health care provider for a prepregnancy visit.  If told by your health care provider, take low-dose aspirin daily starting at age 37.  Find healthy ways to cope with stress, such as: ? Meditation, yoga, or listening to music. ? Journaling. ? Talking to a trusted person. ? Spending time with friends and family. Safety  Always wear your seat belt while driving or riding in a vehicle.  Do not drive: ? If you have been drinking alcohol. Do not ride with someone who has been drinking. ? When you are tired or distracted. ? While texting.  Wear a helmet and other protective equipment during sports activities.  If you have firearms in your house, make sure you follow all gun safety procedures. What's next?  Visit your health care provider once a year for an annual wellness visit.  Ask your health care provider how often you should have your eyes and teeth checked.  Stay up to date on all vaccines. This information is not intended to replace advice given to you by your health care provider. Make sure you discuss any questions you have with your health care provider. Document Revised: 07/16/2020 Document Reviewed: 06/23/2018 Elsevier Patient Education  2021 [employer].

## 2021-02-11 NOTE — Progress Notes (Signed)
Assessment & Plan:  1. Well adult exam Preventive health education provided.  Patient is up-to-date with her mammogram, pap smear, TDaP, and COVID-19 vaccines.  She declines HIV and hepatitis C screening.  She is being referred to gastroenterology for a colonoscopy. - CBC with Differential/Platelet - CMP14+EGFR - Lipid panel - TSH  2. Morbid obesity ([place]) Diet and exercise encouraged.  Discussed switching out snacks after school for healthy snacks. - Semaglutide-Weight Management 0.25 MG/0.5ML SOAJ; Inject 0.25 mg into the skin once a week for 28 days.  Dispense: 2 mL; Refill: 0 - Semaglutide-Weight Management 0.5 MG/0.5ML SOAJ; Inject 0.5 mg into the skin once a week for 28 days.  Dispense: 2 mL; Refill: 0 - Semaglutide-Weight Management 1 MG/0.5ML SOAJ; Inject 1 mg into the skin once a week for 28 days.  Dispense: 2 mL; Refill: 0 - Semaglutide-Weight Management 1.7 MG/0.75ML SOAJ; Inject 1.7 mg into the skin once a week for 28 days.  Dispense: 3 mL; Refill: 0 - Semaglutide-Weight Management 2.4 MG/0.75ML SOAJ; Inject 2.4 mg into the skin once a week for 28 days.  Dispense: 3 mL; Refill: 0  3. Colon cancer screening - Ambulatory referral to Gastroenterology   Follow-up: Return in about 4 weeks (around 03/11/2021) for weight.   Hendricks Limes, MSN, APRN, FNP-C [facility]  Subjective:  Patient ID: Tanya Mccoy, female    DOB: [birth year]  Age: 45 y.o. MRN: [id number]  Patient Care Team: Loman Brooklyn, FNP as PCP - General (Family Medicine) Cox, De Hollingshead, MD as Consulting Physician (Nurse Practitioner)   CC:  Chief Complaint  Patient presents with  . Annual Exam    HPI Tanya Mccoy presents for her annual physical.  Occupation: Pharmacist, hospital, Marital status: Widowed, Substance use: None Diet: Regular - patient reports she eats healthy for breakfast and lunch; it is afterschool snacks that are unhealthy, Exercise: Regular exercise  on her Bowflex and using her beach body exercises Last colonoscopy: Never Last mammogram: 09/23/2020 Last pap smear: 08/01/2019 - repeat in 5 years Hepatitis C Screening: Declined Immunizations: Flu Vaccine: not flu season Tdap Vaccine: up to date  COVID-19 Vaccine: up to date  [place] 2/9 Scores 02/11/2021 12/26/2020 11/21/2019 07/01/2018 03/14/2018 12/10/2017 09/14/2017  PHQ - 2 Score 0 0 0 0 0 0 0     Review of Systems  Constitutional: Negative for chills, fever, malaise/fatigue and weight loss.  HENT: Negative for congestion, ear discharge, ear pain, nosebleeds, sinus pain, sore throat and tinnitus.   Eyes: Negative for blurred vision, double vision, pain, discharge and redness.  Respiratory: Negative for cough, shortness of breath and wheezing.   Cardiovascular: Negative for chest pain, palpitations and leg swelling.  Gastrointestinal: Positive for diarrhea. Negative for abdominal pain, constipation, heartburn, nausea and vomiting.  Genitourinary: Negative for dysuria, frequency and urgency.  Musculoskeletal: Negative for myalgias.  Skin: Negative for rash.  Neurological: Negative for dizziness, seizures, weakness and headaches.  Psychiatric/Behavioral: Negative for depression, substance abuse and suicidal ideas. The patient is not nervous/anxious.      Current Outpatient Medications:  .  cetirizine (ZYRTEC) 10 MG tablet, TAKE 1 TABLET (10 MG TOTAL) BY MOUTH DAILY., Disp: 30 tablet, Rfl: 5 .  fluticasone (FLONASE) 50 MCG/ACT nasal spray, Place 2 sprays into both nostrils daily., Disp: 16 g, Rfl: 6 .  Multiple Vitamin (MULTIVITAMIN WITH MINERALS) TABS tablet, Take 1 tablet by mouth daily., Disp: , Rfl:   No Known Allergies  Past Medical History:  Diagnosis  Date  . Allergy     Past Surgical History:  Procedure Laterality Date  . CESAREAN SECTION      Family History  Problem Relation Age of Onset  . Diabetes Mother   . Healthy Father   . Autoimmune disease  Sister   . Thyroid disease Sister     Social History   Socioeconomic History  . Marital status: Widowed    Spouse name: Not on file  . Number of children: Not on file  . Years of education: Not on file  . Highest education level: Not on file  Occupational History  . Occupation: Pharmacist, hospital    Comment: Tax inspector  Tobacco Use  . Smoking status: Never Smoker  . Smokeless tobacco: Never Used  Vaping Use  . Vaping Use: Never used  Substance and Sexual Activity  . Alcohol use: Yes    Comment: occ  . Drug use: No  . Sexual activity: Not Currently  Other Topics Concern  . Not on file  Social History Narrative  . Not on file   Social Determinants of Health   Financial Resource Strain: Not on file  Food Insecurity: Not on file  Transportation Needs: Not on file  Physical Activity: Not on file  Stress: Not on file  Social Connections: Not on file  Intimate Partner Violence: Not on file      Objective:    BP 116/74   Pulse 79   Temp (!) 97.4 F (36.3 C) (Temporal)   Ht _0  (1.6 m)   Wt 207 lb 12.8 oz (94.3 kg)   SpO2 96%   BMI 36.81 kg/m   Wt Readings from Last 3 Encounters:  02/11/21 207 lb 12.8 oz (94.3 kg)  12/26/20 202 lb 9.6 oz (91.9 kg)  11/21/19 202 lb 3.2 oz (91.7 kg)    Physical Exam Vitals reviewed.  Constitutional:      General: She is not in acute distress.    Appearance: Normal appearance. She is morbidly obese. She is not ill-appearing, toxic-appearing or diaphoretic.  HENT:     Head: Normocephalic and atraumatic.     Right Ear: Tympanic membrane, ear canal and external ear normal. There is no impacted cerumen.     Left Ear: Tympanic membrane, ear canal and external ear normal. There is no impacted cerumen.     Nose: Nose normal. No congestion or rhinorrhea.     Mouth/Throat:     Mouth: Mucous membranes are moist.     Pharynx: Oropharynx is clear. No oropharyngeal exudate or posterior oropharyngeal erythema.  Eyes:      General: No scleral icterus.       Right eye: No discharge.        Left eye: No discharge.     Conjunctiva/sclera: Conjunctivae normal.     Pupils: Pupils are equal, round, and reactive to light.  Cardiovascular:     Rate and Rhythm: Normal rate and regular rhythm.     Heart sounds: Normal heart sounds. No murmur heard. No friction rub. No gallop.   Pulmonary:     Effort: Pulmonary effort is normal. No respiratory distress.     Breath sounds: Normal breath sounds. No stridor. No wheezing, rhonchi or rales.  Abdominal:     General: Abdomen is flat. Bowel sounds are normal. There is no distension.     Palpations: Abdomen is soft. There is no mass.     Tenderness: There is no abdominal tenderness. There is no guarding or rebound.  Hernia: No hernia is present.  Musculoskeletal:        General: Normal range of motion.     Cervical back: Normal range of motion and neck supple. No rigidity. No muscular tenderness.  Lymphadenopathy:     Cervical: No cervical adenopathy.  Skin:    General: Skin is warm and dry.     Capillary Refill: Capillary refill takes less than 2 seconds.  Neurological:     General: No focal deficit present.     Mental Status: She is alert and oriented to person, place, and time. Mental status is at baseline.  Psychiatric:        Mood and Affect: Mood normal.        Behavior: Behavior normal.        Thought Content: Thought content normal.        Judgment: Judgment normal.     Lab Results  Component Value Date   TSH 1.370 11/21/2019   Lab Results  Component Value Date   WBC 4.9 11/21/2019   HGB 12.3 11/21/2019   HCT 37.8 11/21/2019   MCV 84 11/21/2019   PLT 290 11/21/2019   Lab Results  Component Value Date   NA 140 11/21/2019   K 4.1 11/21/2019   CO2 23 11/21/2019   GLUCOSE 81 11/21/2019   BUN 14 11/21/2019   CREATININE 0.72 11/21/2019   BILITOT <0.2 11/21/2019   ALKPHOS 77 11/21/2019   AST 20 11/21/2019   ALT 19 11/21/2019   PROT 6.9  11/21/2019   ALBUMIN 4.2 11/21/2019   CALCIUM 9.2 11/21/2019   Lab Results  Component Value Date   CHOL 193 11/21/2019   Lab Results  Component Value Date   HDL 59 11/21/2019   Lab Results  Component Value Date   LDLCALC 116 (H) 11/21/2019   Lab Results  Component Value Date   TRIG 100 11/21/2019   Lab Results  Component Value Date   CHOLHDL 3.3 11/21/2019   Lab Results  Component Value Date   HGBA1C 5.1 11/21/2019

## 2021-02-11 NOTE — Telephone Encounter (Signed)
Quantity please

## 2021-02-12 LAB — CMP14+EGFR
ALT: 27 IU/L (ref 0–32)
AST: 21 IU/L (ref 0–40)
Albumin/Globulin Ratio: 1.6 (ref 1.2–2.2)
Albumin: 4.5 g/dL (ref 3.8–4.8)
Alkaline Phosphatase: 95 IU/L (ref 44–121)
BUN/Creatinine Ratio: 18 (ref 9–23)
BUN: 14 mg/dL (ref 6–24)
Bilirubin Total: <0.2 mg/dL (ref 0.0–1.2)
CO2: 22 mmol/L (ref 20–29)
Calcium: 9.3 mg/dL (ref 8.7–10.2)
Chloride: 103 mmol/L (ref 96–106)
Creatinine, Ser: 0.77 mg/dL (ref 0.57–1.00)
Globulin, Total: 2.8 g/dL (ref 1.5–4.5)
Glucose: 81 mg/dL (ref 65–99)
Potassium: 4.4 mmol/L (ref 3.5–5.2)
Sodium: 141 mmol/L (ref 134–144)
Total Protein: 7.3 g/dL (ref 6.0–8.5)
eGFR: 97 mL/min/{1.73_m2} (ref >59)

## 2021-02-12 LAB — CBC WITH DIFFERENTIAL/PLATELET
Basophils Absolute: 0 10*3/uL (ref 0.0–0.2)
Basos: 1 %
EOS (ABSOLUTE): 0.4 10*3/uL (ref 0.0–0.4)
Eos: 7 %
Hematocrit: 44.4 % (ref 34.0–46.6)
Hemoglobin: 13.7 g/dL (ref 11.1–15.9)
Immature Grans (Abs): 0 10*3/uL (ref 0.0–0.1)
Immature Granulocytes: 0 %
Lymphocytes Absolute: 1.7 10*3/uL (ref 0.7–3.1)
Lymphs: 34 %
MCH: 26.9 pg (ref 26.6–33.0)
MCHC: 30.9 g/dL — ABNORMAL LOW (ref 31.5–35.7)
MCV: 87 fL (ref 79–97)
Monocytes Absolute: 0.4 10*3/uL (ref 0.1–0.9)
Monocytes: 8 %
Neutrophils Absolute: 2.5 10*3/uL (ref 1.4–7.0)
Neutrophils: 50 %
Platelets: 338 10*3/uL (ref 150–450)
RBC: 5.1 x10E6/uL (ref 3.77–5.28)
RDW: 13.1 % (ref 11.7–15.4)
WBC: 5 10*3/uL (ref 3.4–10.8)

## 2021-02-12 LAB — LIPID PANEL
Chol/HDL Ratio: 3.4 ratio (ref 0.0–4.4)
Cholesterol, Total: 197 mg/dL (ref 100–199)
HDL: 58 mg/dL (ref >39)
LDL Chol Calc (NIH): 121 mg/dL — ABNORMAL HIGH (ref 0–99)
Triglycerides: 102 mg/dL (ref 0–149)
VLDL Cholesterol Cal: 18 mg/dL (ref 5–40)

## 2021-02-12 LAB — TSH: TSH: 1.91 u[IU]/mL (ref 0.450–4.500)

## 2021-02-12 NOTE — Telephone Encounter (Signed)
Can we do a PA on either this or the [facility]? I rx'd Mancel Parsons first and it said Kirke Shaggy was preferred.

## 2021-02-13 ENCOUNTER — Telehealth: Payer: Self-pay

## 2021-02-13 NOTE — Telephone Encounter (Signed)
PA received on Saxenda 41m/ 3 ML  Key: [id number]  Wait for Determination [facility] a [facility].  Sent to plan

## 2021-02-13 NOTE — Telephone Encounter (Signed)
Please make patient aware we have not been able to get either the [facility] or Saxenda covered for her.

## 2021-02-13 NOTE — Telephone Encounter (Signed)
Patient aware and verbalizes understanding. 

## 2021-02-13 NOTE — Telephone Encounter (Signed)
Spoke with pharmacy and medication is not covered.  PA can not be done

## 2021-05-02 ENCOUNTER — Encounter: Payer: Self-pay | Admitting: Family Medicine

## 2021-05-02 ENCOUNTER — Encounter: Payer: BC Managed Care – PPO | Admitting: Family Medicine

## 2021-05-02 ENCOUNTER — Other Ambulatory Visit: Payer: Self-pay

## 2021-05-04 NOTE — Progress Notes (Signed)
Patient left before being seen.

## 2021-05-13 ENCOUNTER — Other Ambulatory Visit: Payer: Self-pay

## 2021-05-13 ENCOUNTER — Encounter: Payer: Self-pay | Admitting: Family Medicine

## 2021-05-13 ENCOUNTER — Ambulatory Visit (INDEPENDENT_AMBULATORY_CARE_PROVIDER_SITE_OTHER): Payer: BC Managed Care – PPO | Admitting: Family Medicine

## 2021-05-13 DIAGNOSIS — J069 Acute upper respiratory infection, unspecified: Secondary | ICD-10-CM | POA: Diagnosis not present

## 2021-05-13 NOTE — Progress Notes (Signed)
Virtual Visit via telephone Note  I connected with Tanya Mccoy on 05/13/21 at 1446 by telephone and verified that I am speaking with the correct person using two identifiers. Tanya Mccoy is currently located at home and patient are currently with her during visit. The provider, Fransisca Kaufmann Yovanna Cogan, MD is located in their office at time of visit.  Call ended at 1454  I discussed the limitations, risks, security and privacy concerns of performing an evaluation and management service by telephone and the availability of in person appointments. I also discussed with the patient that there may be a patient responsible charge related to this service. The patient expressed understanding and agreed to proceed.   History and Present Illness: Patient is calling in for cough and congestion.  She was at a summer camp last week.  She has sore throat and pressure in head and ears and sinuses. She had subjective fever. She denies SOB or wheezing. She had a son that has some symptoms of feeling ill and congestion after this trip.  She is vaccinated and booster.  She cannot recall if she got flu vaccine. She is using cough drops and mucinex and z quil.   1. Upper respiratory tract infection, unspecified type     Outpatient Encounter Medications as of 05/13/2021  Medication Sig   cetirizine (ZYRTEC) 10 MG tablet TAKE 1 TABLET (10 MG TOTAL) BY MOUTH DAILY.   fluticasone (FLONASE) 50 MCG/ACT nasal spray Place 2 sprays into both nostrils daily.   Multiple Vitamin (MULTIVITAMIN WITH MINERALS) TABS tablet Take 1 tablet by mouth daily.   No facility-administered encounter medications on file as of 05/13/2021.    Review of Systems  Constitutional:  Positive for chills and fever.  HENT:  Positive for congestion, postnasal drip, rhinorrhea, sinus pressure, sneezing and sore throat. Negative for ear discharge and ear pain.   Eyes:  Negative for pain, redness and visual disturbance.   Respiratory:  Positive for cough. Negative for chest tightness and shortness of breath.   Cardiovascular:  Negative for chest pain and leg swelling.  Genitourinary:  Negative for difficulty urinating and dysuria.  Musculoskeletal:  Negative for back pain and gait problem.  Skin:  Negative for rash.  Neurological:  Negative for light-headedness and headaches.  Psychiatric/Behavioral:  Negative for agitation and behavioral problems.   All other systems reviewed and are negative.  Observations/Objective: Patient sounds comfortable and in no acute distress  Assessment and Plan: Problem List Items Addressed This Visit   None Visit Diagnoses     Upper respiratory tract infection, unspecified type    -  Primary   Relevant Orders   Novel Coronavirus, NAA ([facility])       Will treat conservatively and recommended Mucinex and nasal saline and Flonase and rest and fluids, will do COVID testing and if anything worsens or does not improve she will call us Follow up plan: Return if symptoms worsen or fail to improve.     I discussed the assessment and treatment plan with the patient. The patient was provided an opportunity to ask questions and all were answered. The patient agreed with the plan and demonstrated an understanding of the instructions.   The patient was advised to call back or seek an in-person evaluation if the symptoms worsen or if the condition fails to improve as anticipated.  The above assessment and management plan was discussed with the patient. The patient verbalized understanding of and has agreed to the management plan. Patient  is aware to call the clinic if symptoms persist or worsen. Patient is aware when to return to the clinic for a follow-up visit. Patient educated on when it is appropriate to go to the emergency department.    I provided 8 minutes of non-face-to-face time during this encounter.    Worthy Rancher, MD

## 2021-05-14 LAB — NOVEL CORONAVIRUS, NAA: SARS-CoV-2, NAA: NOT DETECTED

## 2021-05-14 LAB — SARS-COV-2, NAA 2 DAY TAT

## 2021-05-29 ENCOUNTER — Encounter: Payer: Self-pay | Admitting: Family Medicine

## 2021-06-13 ENCOUNTER — Ambulatory Visit (INDEPENDENT_AMBULATORY_CARE_PROVIDER_SITE_OTHER): Payer: BC Managed Care – PPO | Admitting: Nurse Practitioner

## 2021-06-13 ENCOUNTER — Encounter (HOSPITAL_BASED_OUTPATIENT_CLINIC_OR_DEPARTMENT_OTHER): Payer: Self-pay | Admitting: Nurse Practitioner

## 2021-06-13 ENCOUNTER — Other Ambulatory Visit: Payer: Self-pay

## 2021-06-13 VITALS — BP 129/73 | HR 88 | Ht 63.0 in | Wt 201.4 lb

## 2021-06-13 DIAGNOSIS — Z1211 Encounter for screening for malignant neoplasm of colon: Secondary | ICD-10-CM

## 2021-06-13 DIAGNOSIS — Z13228 Encounter for screening for other metabolic disorders: Secondary | ICD-10-CM

## 2021-06-13 DIAGNOSIS — Z1321 Encounter for screening for nutritional disorder: Secondary | ICD-10-CM | POA: Diagnosis not present

## 2021-06-13 DIAGNOSIS — Z136 Encounter for screening for cardiovascular disorders: Secondary | ICD-10-CM | POA: Diagnosis not present

## 2021-06-13 DIAGNOSIS — R635 Abnormal weight gain: Secondary | ICD-10-CM

## 2021-06-13 DIAGNOSIS — Z1329 Encounter for screening for other suspected endocrine disorder: Secondary | ICD-10-CM | POA: Diagnosis not present

## 2021-06-13 DIAGNOSIS — Z130 Encounter for screening for diseases of the blood and blood-forming organs and certain disorders involving the immune mechanism: Secondary | ICD-10-CM

## 2021-06-13 DIAGNOSIS — S46912A Strain of unspecified muscle, fascia and tendon at shoulder and upper arm level, left arm, initial encounter: Secondary | ICD-10-CM

## 2021-06-13 DIAGNOSIS — R5383 Other fatigue: Secondary | ICD-10-CM

## 2021-06-13 DIAGNOSIS — Z13220 Encounter for screening for lipoid disorders: Secondary | ICD-10-CM

## 2021-06-13 DIAGNOSIS — Z0000 Encounter for general adult medical examination without abnormal findings: Secondary | ICD-10-CM

## 2021-06-13 HISTORY — DX: Strain of unspecified muscle, fascia and tendon at shoulder and upper arm level, left arm, initial encounter: S46.912A

## 2021-06-13 NOTE — Progress Notes (Signed)
Worthy Keeler, DNP, AGNP-c Primary Care Services ______________________________________________________________________  HPI Tanya Mccoy is a 45 y.o. female presenting to [place] at [place] today to establish care.   Patient Care Team: Kamoria Lucien, Coralee Pesa, NP as PCP - General (Nurse Practitioner) Cox, De Hollingshead, MD as Consulting Physician (Nurse Practitioner)  Health Maintenance  Topic Date Due   Colon Cancer Screening  Never done   Flu Shot  07/25/2021*   Hepatitis C Screening: USPSTF Recommendation to screen - Ages 18-79 yo.  02/11/2022*   HIV Screening  02/11/2022*   Mammogram  09/23/2021   Pap Smear  07/31/2024   Tetanus Vaccine  07/14/2029   COVID-19 Vaccine  Completed   Pneumococcal Vaccination  Aged Out   HPV Vaccine  Aged Out  *Topic was postponed. The date shown is not the original due date.     Concerns today: Fatigue Patient endorses increased fatigue since at least the beginning of this year. 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 She reports she feels her "hormones are out of whack" She endorses more cramping and bloating with her menstrual cycle however is still having a monthly menstrual cycle with no changes in flow She reports she sleeps well and does not snore She does endorse recently that she has noticed that her resting heart rate is higher She is also very active and eats healthy but is unable to lose weight Her mother and her maternal aunts did develop type 2 diabetes she has not been tested for this She denies diarrhea or constipation, intolerance to heat or cold, or muscle fatigue Denies symptoms of depression or anxiety Left forearm pain Left forearm and bicep pain that started approximately 1 month ago Endorses doing a lot of heavy lifting and working around the house Does not have any specific injury that she can recall Reports that the pain feels muscular but is improving Was unable to  lift anything of weight with that arm and now is able to however she does feel tightness in the muscle No redness, swelling, erythema, edema She has been trying to rest the arm which does seem to be helping it more   Patient Active Problem List   Diagnosis Date Noted   Weight gain 06/13/2021   Fatigue 06/13/2021   Encounter for medical examination to establish care 06/13/2021   Muscle strain of left upper arm 06/13/2021   Morbid obesity ([place]) 02/11/2021   Seasonal allergies 11/21/2019    PHQ9 Today: Depression screen [facility] 2/9 06/13/2021 05/02/2021 02/11/2021  Decreased Interest 0 0 0  Down, Depressed, Hopeless 0 0 0  PHQ - 2 Score 0 0 0  Altered sleeping 0 0 -  Tired, decreased energy 0 0 -  Change in appetite 2 1 -  Feeling bad or failure about yourself  0 0 -  Trouble concentrating 0 0 -  Moving slowly or fidgety/restless 0 0 -  Suicidal thoughts 0 0 -  PHQ-9 Score 2 1 -  Difficult doing work/chores Not difficult at all Not difficult at all -   GAD7 Today: GAD 7 : Generalized Anxiety Score 06/13/2021 05/02/2021  Nervous, Anxious, on Edge 0 0  Control/stop worrying 0 0  Worry too much - different things 0 0  Trouble relaxing 0 0  Restless 0 0  Easily annoyed or irritable 0 0  Afraid - awful might happen 0 0  Total GAD 7 Score 0 0  Anxiety Difficulty - Not difficult at all   ______________________________________________________________________  PMH Past Medical History:  Diagnosis Date   Allergy     ROS All review of systems negative except what is listed in the HPI  PHYSICAL EXAM Physical Exam Vitals and nursing note reviewed.  Constitutional:      General: She is not in acute distress.    Appearance: Normal appearance.  HENT:     Head: Normocephalic and atraumatic.     Right Ear: Hearing, tympanic membrane, ear canal and external ear normal.     Left Ear: Hearing, tympanic membrane, ear canal and external ear normal.     Nose: Nose normal.     Right Sinus: No  maxillary sinus tenderness or frontal sinus tenderness.     Left Sinus: No maxillary sinus tenderness or frontal sinus tenderness.     Mouth/Throat:     Lips: Pink.     Mouth: Mucous membranes are moist.     Pharynx: Oropharynx is clear.  Eyes:     General: Lids are normal. Vision grossly intact.     Extraocular Movements: Extraocular movements intact.     Conjunctiva/sclera: Conjunctivae normal.     Pupils: Pupils are equal, round, and reactive to light.     Funduscopic exam:    Right eye: No hemorrhage. Red reflex present.        Left eye: No hemorrhage. Red reflex present.    Visual Fields: Right eye visual fields normal and left eye visual fields normal.  Neck:     Thyroid: No thyromegaly.     Vascular: No carotid bruit.  Cardiovascular:     Rate and Rhythm: Normal rate and regular rhythm.     Pulses: Normal pulses.     Heart sounds: Normal heart sounds. No murmur heard. Pulmonary:     Effort: Pulmonary effort is normal. No respiratory distress.     Breath sounds: Normal breath sounds.  Abdominal:     General: Abdomen is flat. Bowel sounds are normal. There is no distension.     Palpations: Abdomen is soft. There is no mass.     Tenderness: There is no abdominal tenderness. There is no right CVA tenderness, left CVA tenderness, guarding or rebound.  Musculoskeletal:        General: Tenderness present. Normal range of motion.     Cervical back: Full passive range of motion without pain and neck supple. No tenderness.     Right lower leg: No edema.     Left lower leg: No edema.     Comments: Mild tenderness present to the right bicep.  No decrease in range of motion.  No edema, erythema.  Circulation intact  Lymphadenopathy:     Cervical: No cervical adenopathy.  Skin:    General: Skin is warm and dry.     Capillary Refill: Capillary refill takes less than 2 seconds.  Neurological:     General: No focal deficit present.     Mental Status: She is alert and oriented to  person, place, and time.     Cranial Nerves: Cranial nerves are intact.     Sensory: Sensation is intact.     Motor: Motor function is intact.     Coordination: Coordination is intact.     Gait: Gait is intact.  Psychiatric:        Attention and Perception: Attention and perception normal.        Mood and Affect: Mood normal.        Speech: Speech normal.  Behavior: Behavior normal. Behavior is cooperative.        Thought Content: Thought content normal.        Cognition and Memory: Cognition and memory normal.        Judgment: Judgment normal.   ______________________________________________________________________ ASSESSMENT AND PLAN Problem List Items Addressed This Visit     Weight gain    Increased weight gain despite frequent activity and exercise and monitoring diet. Discussed with the patient that this could possibly be a hormonal issue due to perimenopause however other etiologies may be present.  She does have a strong family history of type 2 diabetes in her mother side. We will obtain labs today to evaluate for possible other causes If lab results are inconclusive we can consider looking into possible oral contraceptives to help with maintaining hormone levels and medications to help boost metabolism Follow-up to be determined based on lab results      Relevant Orders   CBC With Differential   Comprehensive metabolic panel   Lipid panel   TSH   VITAMIN D 25 Hydroxy (Vit-D Deficiency, Fractures)   Hemoglobin A1c   Fatigue    Significant increased fatigue of unknown etiology. Did discuss with patient that this could possibly be a result of hormonal changes due to perimenopause however many other options are possible as well. Will obtain labs today to evaluate thyroid, vitamin D, and diabetes. Does not appear to have a component of obstructive sleep apnea present. Strong family history of type 2 diabetes. If labs are unrevealing we can consider medication to  help boost metabolism with diet and exercise. Follow-up determined by lab results      Relevant Orders   CBC With Differential   Comprehensive metabolic panel   TSH   VITAMIN D 25 Hydroxy (Vit-D Deficiency, Fractures)   Hemoglobin A1c   Encounter for medical examination to establish care - Primary    Review of current and past medical history, social history, medication, and family history.  Review of care gaps and health maintenance recommendations.  Records from recent providers to be requested if not available in Chart Review or Care Everywhere.  Recommendations for health maintenance, diet, and exercise provided.  Exam fairly unremarkable today with the exception of the left bicep tenderness Will obtain labs today She is due for colon cancer screening-discussed colonoscopy versus Cologuard no family history of colon cancer or personal concerns or changes in bowel function- Cologuard order placed today She is up-to-date on her Pap and mammogram. COVID and Tdap vaccines are up-to-date. Follow-up in 1 year for CPE and labs or sooner if needed      Muscle strain of left upper arm    Symptoms and presentation consistent with muscle strain to the bicep of the left upper extremity No red flag warnings present today Appears to be healing well Recommend slowly increasing exercise and use of arm and avoid overworking the arm to prevent reinjury. Follow-up if symptoms worsen or fail to improve      Other Visit Diagnoses     Encounter for lipid screening for cardiovascular disease       Relevant Orders   Lipid panel   Screening for deficiency anemia       Relevant Orders   CBC With Differential   Screening for endocrine, nutritional, metabolic and immunity disorder       Relevant Orders   CBC With Differential   Comprehensive metabolic panel   Lipid panel   TSH   VITAMIN D  25 Hydroxy (Vit-D Deficiency, Fractures)   Hemoglobin A1c   Screening for thyroid disorder        Relevant Orders   TSH   Screening for colon cancer       Relevant Orders   Cologuard       Education provided today during visit and on AVS for patient to review at home.  Diet and Exercise recommendations provided.  Current diagnoses and recommendations discussed. HM recommendations reviewed with recommendations.    Outpatient Encounter Medications as of 06/13/2021  Medication Sig   cetirizine (ZYRTEC) 10 MG tablet TAKE 1 TABLET (10 MG TOTAL) BY MOUTH DAILY.   fluticasone (FLONASE) 50 MCG/ACT nasal spray Place 2 sprays into both nostrils daily.   Multiple Vitamin (MULTIVITAMIN WITH MINERALS) TABS tablet Take 1 tablet by mouth daily.   No facility-administered encounter medications on file as of 06/13/2021.    No follow-ups on file.  Time: 54 minutes, >50% spent counseling, care coordination, chart review, and documentation.   Orma Render, DNP, AGNP-c

## 2021-06-13 NOTE — Assessment & Plan Note (Signed)
Review of current and past medical history, social history, medication, and family history.  Review of care gaps and health maintenance recommendations.  Records from recent providers to be requested if not available in Chart Review or Care Everywhere.  Recommendations for health maintenance, diet, and exercise provided.  Exam fairly unremarkable today with the exception of the left bicep tenderness Will obtain labs today She is due for colon cancer screening-discussed colonoscopy versus Cologuard no family history of colon cancer or personal concerns or changes in bowel function- Cologuard order placed today She is up-to-date on her Pap and mammogram. COVID and Tdap vaccines are up-to-date. Follow-up in 1 year for CPE and labs or sooner if needed

## 2021-06-13 NOTE — Patient Instructions (Addendum)
Recommendations from today's visit: We will let you know the results of your labs and if we need to make any changes to your plan of care.  I have sent the order for cologuard- they will ship this directly to your home. It is best to send a sample within a week of receiving the package to avoid forgetting or the package expiring.   Information on diet, exercise, and health maintenance recommendations are listed below. This is information to help you be sure you are on track for optimal health and monitoring.   Please look over this and let us know if you have any questions or if you have completed any of the health maintenance outside of [facility] so that we can be sure your records are up to date.  ___________________________________________________________  Thank you for choosing [place] at [facility] for your Primary Care needs. I am excited for the opportunity to partner with you to meet your health care goals. It was a pleasure meeting you today!  I am an Adult-Geriatric Nurse Practitioner with a background in caring for patients for more than 20 years. I provide primary care and sports medicine services to patients age 53 and older within this office. I am also the director of the APP Fellowship with [facility].   I am passionate about providing the best service to you through preventive medicine and supportive care. I consider you a part of the medical team and value your input. I work diligently to ensure that you are heard and your needs are met in a safe and effective manner. I want you to feel comfortable with me as your provider and want you to know that your health concerns are important to me.  For your information, our office hours are Monday- Friday 8:00 AM - 5:00 PM At this time I am not in the office on Wednesdays.  If you have questions or concerns, please call our office at [phone] or send [place] a MyChart message and we will respond as quickly as  possible.   For all urgent or time sensitive needs we ask that you please call the office to avoid delays. MyChart is not constantly monitored and replies may take up to 72 business hours.  MyChart Policy: MyChart allows for you to see your visit notes, after visit summary, provider recommendations, lab and tests results, make an appointment, request refills, and contact your provider or the office for non-urgent questions or concerns. Providers are seeing patients during normal business hours and do not have built in time to review MyChart messages.  We ask that you allow a minimum of 4 business days for responses to [employer]. For this reason, please do not send urgent requests through [place]. Please call the office at [phone]. Complex MyChart concerns may require a visit. Your provider may request you schedule a virtual or in person visit to ensure we are providing the best care possible. MyChart messages sent after 4:00 PM on Friday will not be received by the provider until Monday morning.    Lab and Test Results: You will receive your lab and test results on MyChart as soon as they are completed and results have been sent by the lab or testing facility. Due to this service, you will receive your results BEFORE your provider.  I review lab and tests results each morning prior to seeing patients. Some results require collaboration with other providers to ensure you are receiving the most appropriate care. For  this reason, we ask that you please allow a minimum of 4 business days for your provider to receive and review lab and test results and contact you about these.  Most lab and test result comments from the provider will be sent through [place]. Your provider may recommend changes to the plan of care, follow-up visits, repeat testing, ask questions, or request an office visit to discuss these results. You may reply directly to this message or call the office at [phone] to  provide information for the provider or set up an appointment. In some instances, you will be called with test results and recommendations. Please let us know if this is preferred and we will make note of this in your chart to provide this for you.    If you have not heard a response to your lab or test results in 72 business hours, please call the office to let us know.   After Hours: For all non-emergency after hours needs, please call the office at [phone] and select the option to reach the on-call provider service. On-call services are shared between multiple [place] offices and therefore it will not be possible to speak directly with your provider. On-call providers may provide medical advice and recommendations, but are unable to provide refills for maintenance medications.  For all emergency or urgent medical needs after normal business hours, we recommend that you seek care at the closest Urgent Care or Emergency Department to ensure appropriate treatment in a timely manner.  [facility] [place] has a 24 hour emergency room located on the ground floor for your convenience.    Please do not hesitate to reach out to [place] with concerns.   Thank you, again, for choosing me as your health care partner. I appreciate your trust and look forward to learning more about you.   Worthy Keeler, DNP, AGNP-c ___________________________________________________________  Health Maintenance Recommendations Screening Testing Mammogram Every 1 -2 years based on history and risk factors Starting at age 31 Pap Smear Ages 21-39 every 3 years Ages 30-65 every 5 years with HPV testing More frequent testing may be required based on results and history Colon Cancer Screening Every 1-10 years based on test performed, risk factors, and history Starting at age 50 Bone Density Screening Every 2-10 years based on history Starting at age 75 for women Recommendations for men differ based  on medication usage, history, and risk factors AAA Screening One time ultrasound Men 76-55 years old who have every smoked Lung Cancer Screening Low Dose Lung CT every 12 months Age 45-80 years with a 30 pack-year smoking history who still smoke or who have quit within the last 15 years  Screening Labs Routine  Labs: Complete Blood Count (CBC), Complete Metabolic Panel (CMP), Cholesterol (Lipid Panel) Every 6-12 months based on history and medications May be recommended more frequently based on current conditions or previous results Hemoglobin A1c Lab Every 3-12 months based on history and previous results Starting at age 4 or earlier with diagnosis of diabetes, high cholesterol, BMI >26, and/or risk factors Frequent monitoring for patients with diabetes to ensure blood sugar control Thyroid Panel (TSH w/ T3 & T4) Every 6 months based on history, symptoms, and risk factors May be repeated more often if on medication HIV One time testing for all patients 62 and older May be repeated more frequently for patients with increased risk factors or exposure Hepatitis C One time testing for all patients 40 and older May be repeated more frequently  for patients with increased risk factors or exposure Gonorrhea, Chlamydia Every 12 months for all sexually active persons 13-24 years Additional monitoring may be recommended for those who are considered high risk or who have symptoms PSA Men 36-34 years old with risk factors Additional screening may be recommended from age 35-69 based on risk factors, symptoms, and history  Vaccine Recommendations Tetanus Booster All adults every 10 years Flu Vaccine All patients 6 months and older every year COVID Vaccine All patients 12 years and older Initial dosing with booster May recommend additional booster based on age and health history HPV Vaccine 2 doses all patients age 50-26 Dosing may be considered for patients over 26 Shingles Vaccine  (Shingrix) 2 doses all adults 87 years and older Pneumonia (Pneumovax 23) All adults 57 years and older May recommend earlier dosing based on health history Pneumonia (Prevnar 53) All adults 76 years and older Dosed 1 year after Pneumovax 23  Additional Screening, Testing, and Vaccinations may be recommended on an individualized basis based on family history, health history, risk factors, and/or exposure.  __________________________________________________________  Diet Recommendations for All Patients  I recommend that all patients maintain a diet low in saturated fats, carbohydrates, and cholesterol. While this can be challenging at first, it is not impossible and small changes can make big differences.  Things to try: Decreasing the amount of soda, sweet tea, and/or juice to one or less per day and replace with water While water is always the first choice, if you do not like water you may consider adding a water additive without sugar to improve the taste other sugar free drinks Replace potatoes with a brightly colored vegetable at dinner Use healthy oils, such as canola oil or olive oil, instead of butter or hard margarine Limit your bread intake to two pieces or less a day Replace regular pasta with low carb pasta options Bake, broil, or grill foods instead of frying Monitor portion sizes  Eat smaller, more frequent meals throughout the day instead of large meals  An important thing to remember is, if you love foods that are not great for your health, you don't have to give them up completely. Instead, allow these foods to be a reward when you have done well. Allowing yourself to still have special treats every once in a while is a nice way to tell yourself thank you for working hard to keep yourself healthy.   Also remember that every day is a new day. If you have a bad day and "fall off the wagon", you can still climb right back up and keep moving along on your journey!  We  have resources available to help you!  Some websites that may be helpful include: www.http://carter.biz/  Www.VeryWellFit.com _____________________________________________________________  Activity Recommendations for All Patients  I recommend that all adults get at least 20 minutes of moderate physical activity that elevates your heart rate at least 5 days out of the week.  Some examples include: Walking or jogging at a pace that allows you to carry on a conversation Cycling (stationary bike or outdoors) Water aerobics Yoga Weight lifting Dancing If physical limitations prevent you from putting stress on your joints, exercise in a pool or seated in a chair are excellent options.  Do determine your MAXIMUM heart rate for activity: YOUR AGE - 220 = MAX HeartRate   Remember! Do not push yourself too hard.  Start slowly and build up your pace, speed, weight, time in exercise, etc.  Allow your body  to rest between exercise and get good sleep. You will need more water than normal when you are exerting yourself. Do not wait until you are thirsty to drink. Drink with a purpose of getting in at least 8, 8 ounce glasses of water a day plus more depending on how much you exercise and sweat.    If you begin to develop dizziness, chest pain, abdominal pain, jaw pain, shortness of breath, headache, vision changes, lightheadedness, or other concerning symptoms, stop the activity and allow your body to rest. If your symptoms are severe, seek emergency evaluation immediately. If your symptoms are concerning, but not severe, please let us know so that we can recommend further evaluation.   ________________________________________________________________

## 2021-06-13 NOTE — Assessment & Plan Note (Signed)
Significant increased fatigue of unknown etiology. Did discuss with patient that this could possibly be a result of hormonal changes due to perimenopause however many other options are possible as well. Will obtain labs today to evaluate thyroid, vitamin D, and diabetes. Does not appear to have a component of obstructive sleep apnea present. Strong family history of type 2 diabetes. If labs are unrevealing we can consider medication to help boost metabolism with diet and exercise. Follow-up determined by lab results

## 2021-06-13 NOTE — Assessment & Plan Note (Signed)
Increased weight gain despite frequent activity and exercise and monitoring diet. Discussed with the patient that this could possibly be a hormonal issue due to perimenopause however other etiologies may be present.  She does have a strong family history of type 2 diabetes in her mother side. We will obtain labs today to evaluate for possible other causes If lab results are inconclusive we can consider looking into possible oral contraceptives to help with maintaining hormone levels and medications to help boost metabolism Follow-up to be determined based on lab results

## 2021-06-13 NOTE — Assessment & Plan Note (Signed)
Symptoms and presentation consistent with muscle strain to the bicep of the left upper extremity No red flag warnings present today Appears to be healing well Recommend slowly increasing exercise and use of arm and avoid overworking the arm to prevent reinjury. Follow-up if symptoms worsen or fail to improve

## 2021-06-14 LAB — COMPREHENSIVE METABOLIC PANEL WITH GFR
ALT: 16 IU/L (ref 0–32)
AST: 15 IU/L (ref 0–40)
Albumin/Globulin Ratio: 1.7 (ref 1.2–2.2)
Albumin: 4.6 g/dL (ref 3.8–4.8)
Alkaline Phosphatase: 84 IU/L (ref 44–121)
BUN/Creatinine Ratio: 16 (ref 9–23)
BUN: 12 mg/dL (ref 6–24)
Bilirubin Total: 0.2 mg/dL (ref 0.0–1.2)
CO2: 23 mmol/L (ref 20–29)
Calcium: 9.5 mg/dL (ref 8.7–10.2)
Chloride: 104 mmol/L (ref 96–106)
Creatinine, Ser: 0.76 mg/dL (ref 0.57–1.00)
Globulin, Total: 2.7 g/dL (ref 1.5–4.5)
Glucose: 89 mg/dL (ref 65–99)
Potassium: 3.8 mmol/L (ref 3.5–5.2)
Sodium: 140 mmol/L (ref 134–144)
Total Protein: 7.3 g/dL (ref 6.0–8.5)
eGFR: 98 mL/min/1.73

## 2021-06-14 LAB — CBC WITH DIFFERENTIAL
Basophils Absolute: 0 x10E3/uL (ref 0.0–0.2)
Basos: 0 %
EOS (ABSOLUTE): 0.3 x10E3/uL (ref 0.0–0.4)
Eos: 4 %
Hematocrit: 40.7 % (ref 34.0–46.6)
Hemoglobin: 13.2 g/dL (ref 11.1–15.9)
Immature Grans (Abs): 0 x10E3/uL (ref 0.0–0.1)
Immature Granulocytes: 0 %
Lymphocytes Absolute: 2.7 x10E3/uL (ref 0.7–3.1)
Lymphs: 38 %
MCH: 27.7 pg (ref 26.6–33.0)
MCHC: 32.4 g/dL (ref 31.5–35.7)
MCV: 86 fL (ref 79–97)
Monocytes Absolute: 0.6 x10E3/uL (ref 0.1–0.9)
Monocytes: 8 %
Neutrophils Absolute: 3.5 x10E3/uL (ref 1.4–7.0)
Neutrophils: 50 %
RBC: 4.76 x10E6/uL (ref 3.77–5.28)
RDW: 12.5 % (ref 11.7–15.4)
WBC: 7.1 x10E3/uL (ref 3.4–10.8)

## 2021-06-14 LAB — VITAMIN D 25 HYDROXY (VIT D DEFICIENCY, FRACTURES): Vit D, 25-Hydroxy: 23.3 ng/mL — ABNORMAL LOW (ref 30.0–100.0)

## 2021-06-14 LAB — TSH: TSH: 1.46 u[IU]/mL (ref 0.450–4.500)

## 2021-06-14 LAB — LIPID PANEL
Chol/HDL Ratio: 3.6 ratio (ref 0.0–4.4)
Cholesterol, Total: 187 mg/dL (ref 100–199)
HDL: 52 mg/dL (ref >39)
LDL Chol Calc (NIH): 109 mg/dL — ABNORMAL HIGH (ref 0–99)
Triglycerides: 148 mg/dL (ref 0–149)
VLDL Cholesterol Cal: 26 mg/dL (ref 5–40)

## 2021-06-14 LAB — HEMOGLOBIN A1C
Est. average glucose Bld gHb Est-mCnc: 117 mg/dL
Hgb A1c MFr Bld: 5.7 % — ABNORMAL HIGH (ref 4.8–5.6)

## 2021-06-16 NOTE — Progress Notes (Signed)
Please call patient with results and to schedule repeat vitamin D lab in 8 weeks with nurse visit and follow-up visit for pre-diabetes in 3 months.   Labs overall look good. Vitamin D is low- have sent in Vitamin D to be taken once a week for replacement. We will recheck this lab in 8 weeks.   Hemoglobin A1c shows Pre-diabetes. I would like to see you back to check this again in about 3 months. In the meantime, I recommend a low carbohydrate diet. 1200-1500 calories per day with no more than 150grams of carbohydrates. Keep portions of pasta, bread, rice, sugar/sweet foods, and potatoes to a minimum. We will determine if we need medication at the next visit.

## 2021-06-18 ENCOUNTER — Telehealth (HOSPITAL_BASED_OUTPATIENT_CLINIC_OR_DEPARTMENT_OTHER): Payer: Self-pay

## 2021-06-18 NOTE — Telephone Encounter (Signed)
Called patient to discuss lab results. Patient is aware and agreeable to recommendations. Patient will call back to schedule 8 week Vitamin D nurse visit. Instructed patient to contact the office with questions and concerns.

## 2021-06-18 NOTE — Telephone Encounter (Signed)
-----   Message from Orma Render, NP sent at 06/16/2021  8:26 AM EDT ----- Please call patient with results and to schedule repeat vitamin D lab in 8 weeks with nurse visit and follow-up visit for pre-diabetes in 3 months.   Labs overall look good. Vitamin D is low- have sent in Vitamin D to be taken once a week for replacement. We will recheck this lab in 8 weeks.   Hemoglobin A1c shows Pre-diabetes. I would like to see you back to check this again in about 3 months. In the meantime, I recommend a low carbohydrate diet. 1200-1500 calories per day with no more than 150grams of carbohydrates. Keep portions of pasta, bread, rice, sugar/sweet foods, and potatoes to a minimum. We will determine if we need medication at the next visit.

## 2021-06-26 DIAGNOSIS — M9902 Segmental and somatic dysfunction of thoracic region: Secondary | ICD-10-CM | POA: Diagnosis not present

## 2021-06-26 DIAGNOSIS — M9903 Segmental and somatic dysfunction of lumbar region: Secondary | ICD-10-CM | POA: Diagnosis not present

## 2021-06-26 DIAGNOSIS — M62830 Muscle spasm of back: Secondary | ICD-10-CM | POA: Diagnosis not present

## 2021-06-26 DIAGNOSIS — M9901 Segmental and somatic dysfunction of cervical region: Secondary | ICD-10-CM | POA: Diagnosis not present

## 2021-06-28 LAB — COLOGUARD: Cologuard: NEGATIVE

## 2021-07-09 NOTE — Progress Notes (Signed)
Cologuard results are normal. We will plan to repeat in 3 years unless there are any issues.   Have a great day! SaraBeth

## 2021-07-10 DIAGNOSIS — M9902 Segmental and somatic dysfunction of thoracic region: Secondary | ICD-10-CM | POA: Diagnosis not present

## 2021-07-10 DIAGNOSIS — M9901 Segmental and somatic dysfunction of cervical region: Secondary | ICD-10-CM | POA: Diagnosis not present

## 2021-07-10 DIAGNOSIS — M62830 Muscle spasm of back: Secondary | ICD-10-CM | POA: Diagnosis not present

## 2021-07-10 DIAGNOSIS — M9903 Segmental and somatic dysfunction of lumbar region: Secondary | ICD-10-CM | POA: Diagnosis not present

## 2021-07-17 ENCOUNTER — Telehealth (HOSPITAL_BASED_OUTPATIENT_CLINIC_OR_DEPARTMENT_OTHER): Payer: Self-pay

## 2021-07-17 NOTE — Telephone Encounter (Signed)
-----   Message from Orma Render, NP sent at 07/09/2021 10:58 AM EDT ----- Cologuard results are normal. We will plan to repeat in 3 years unless there are any issues.   Have a great day! SaraBeth

## 2021-07-17 NOTE — Telephone Encounter (Signed)
Per DPR lvm on patient's home phone regarding normal Cologard results.  Instructed patient to contact the office with any questions or concerns.

## 2021-07-24 DIAGNOSIS — M9901 Segmental and somatic dysfunction of cervical region: Secondary | ICD-10-CM | POA: Diagnosis not present

## 2021-07-24 DIAGNOSIS — M9903 Segmental and somatic dysfunction of lumbar region: Secondary | ICD-10-CM | POA: Diagnosis not present

## 2021-07-24 DIAGNOSIS — M62830 Muscle spasm of back: Secondary | ICD-10-CM | POA: Diagnosis not present

## 2021-07-24 DIAGNOSIS — M9902 Segmental and somatic dysfunction of thoracic region: Secondary | ICD-10-CM | POA: Diagnosis not present

## 2021-08-14 DIAGNOSIS — M9903 Segmental and somatic dysfunction of lumbar region: Secondary | ICD-10-CM | POA: Diagnosis not present

## 2021-08-14 DIAGNOSIS — M9901 Segmental and somatic dysfunction of cervical region: Secondary | ICD-10-CM | POA: Diagnosis not present

## 2021-08-14 DIAGNOSIS — M9902 Segmental and somatic dysfunction of thoracic region: Secondary | ICD-10-CM | POA: Diagnosis not present

## 2021-08-14 DIAGNOSIS — M62830 Muscle spasm of back: Secondary | ICD-10-CM | POA: Diagnosis not present

## 2021-08-28 DIAGNOSIS — M9902 Segmental and somatic dysfunction of thoracic region: Secondary | ICD-10-CM | POA: Diagnosis not present

## 2021-08-28 DIAGNOSIS — M9903 Segmental and somatic dysfunction of lumbar region: Secondary | ICD-10-CM | POA: Diagnosis not present

## 2021-08-28 DIAGNOSIS — M62830 Muscle spasm of back: Secondary | ICD-10-CM | POA: Diagnosis not present

## 2021-08-28 DIAGNOSIS — M9901 Segmental and somatic dysfunction of cervical region: Secondary | ICD-10-CM | POA: Diagnosis not present

## 2021-09-15 DIAGNOSIS — M9903 Segmental and somatic dysfunction of lumbar region: Secondary | ICD-10-CM | POA: Diagnosis not present

## 2021-09-15 DIAGNOSIS — M9901 Segmental and somatic dysfunction of cervical region: Secondary | ICD-10-CM | POA: Diagnosis not present

## 2021-09-15 DIAGNOSIS — M62830 Muscle spasm of back: Secondary | ICD-10-CM | POA: Diagnosis not present

## 2021-09-15 DIAGNOSIS — M9902 Segmental and somatic dysfunction of thoracic region: Secondary | ICD-10-CM | POA: Diagnosis not present

## 2021-10-09 DIAGNOSIS — M9903 Segmental and somatic dysfunction of lumbar region: Secondary | ICD-10-CM | POA: Diagnosis not present

## 2021-10-09 DIAGNOSIS — M9902 Segmental and somatic dysfunction of thoracic region: Secondary | ICD-10-CM | POA: Diagnosis not present

## 2021-10-09 DIAGNOSIS — M62830 Muscle spasm of back: Secondary | ICD-10-CM | POA: Diagnosis not present

## 2021-10-09 DIAGNOSIS — M9901 Segmental and somatic dysfunction of cervical region: Secondary | ICD-10-CM | POA: Diagnosis not present

## 2021-10-22 ENCOUNTER — Other Ambulatory Visit: Payer: Self-pay

## 2021-10-22 ENCOUNTER — Ambulatory Visit (INDEPENDENT_AMBULATORY_CARE_PROVIDER_SITE_OTHER): Payer: BC Managed Care – PPO | Admitting: Nurse Practitioner

## 2021-10-22 ENCOUNTER — Encounter (HOSPITAL_BASED_OUTPATIENT_CLINIC_OR_DEPARTMENT_OTHER): Payer: Self-pay | Admitting: Nurse Practitioner

## 2021-10-22 VITALS — BP 117/72 | HR 88 | Ht 63.0 in | Wt 195.6 lb

## 2021-10-22 DIAGNOSIS — M79603 Pain in arm, unspecified: Secondary | ICD-10-CM | POA: Diagnosis not present

## 2021-10-22 DIAGNOSIS — M79604 Pain in right leg: Secondary | ICD-10-CM | POA: Diagnosis not present

## 2021-10-22 DIAGNOSIS — R200 Anesthesia of skin: Secondary | ICD-10-CM | POA: Diagnosis not present

## 2021-10-22 DIAGNOSIS — Z23 Encounter for immunization: Secondary | ICD-10-CM | POA: Diagnosis not present

## 2021-10-22 NOTE — Assessment & Plan Note (Signed)
Episodic bilateral sharp pain and numbness in hands and and forearms with sudden onset.  Coinciding with similar pain to the right lower extremity.  Etiology unknown.  Recent history of COVID and suspected herpes zoster. Zoster has healed with hypopigmentation to the areas of blisters with no other residual evidence of post herpetic neuralgia.  Labs today to r/o nutritional deficiency, inflammatory process, or diabetes. Thyroid labs taken in August with no evidence of dysfunction.  If labs normal, will consider neurology referral for EMG and evaluation. No abnormality in strength or motor function present today. No alarm symptoms.  Will follow.

## 2021-10-22 NOTE — Patient Instructions (Signed)
The pain you are experieincing does sound like it is coming from the nerves. I am going to check some labs today to see if we can find a common cause which could very easily be a vitamin deficiency. Your thyroid levels were just checked and were normal, so this is very unlikely.   If we do not have an answer with the labs, we may need to consider looking further with scheduling a visit to neurology for a EMG which looks at the nerve conduction to see if there is an issue there. We will cross that bridge if we come to it, but I wanted to give you a heads up.   Let me know if this changes in any way or gets worse or more frequent.   I am going to do some research to see if recent COVID or Shingles could cause this and I will let you know.

## 2021-10-22 NOTE — Progress Notes (Signed)
Established Patient Office Visit  Subjective:  Patient ID: Tanya Mccoy, female    DOB: 03/31/1976  Age: 45 y.o. MRN: [id number]  CC:  Chief Complaint  Patient presents with   Acute Visit    Patient presents today with sharp shooting pain in right leg and bilateral arm X 2. Nothing makes it better it last about 30 seconds patient stated. Patient states it's an achy pain. Patient would like a flu while in the office today.     HPI Tanya Mccoy presents for pain in bilateral arms and right leg.  First noticed about 2 days ago.  Endorses sharp, shooting pain with a numb sensation that last for about 30 seconds at a time and then subsides. Residual achy pain continues for a while following the event.   She reports when the pain occurs she is unable to walk on it.  She can feel the episode coming on and feels that the muscle may tighten immediately before the pain starts.  The right leg is consistent with the pain, occurring about every few hours. The pain starts at the distal anterior portion of the right shin and radiations proximally up the anterior portion of the shin and into the thigh.  The bilateral arm pain has only occurred a couple of times and started between the pointer and middle finger on the dorsal side of the hand radiation proximally up the posterior forearm to the elbow.  While it is happening she reports the pain "stops her in her tracks" and is severe.  She has never had anything like this happen in the past.  She has no known injury to the neck or back. She does go to a chiropractor for adjustment every 4 weeks, but this did not start immediately following a visit.  She had COVID in October. She endorses a red, painful, burning, vesicular rash on her right forearm that come on suddenly and lasted for about a week, ending a few days ago on the left forearm. The area of the rash is consistent with the pain, but there were no other rashes.  She has  had no fever, chills, CP, palpitations, swelling, redness, warmth,. She has checked her BP when the events occur and it is normal.  She feels that the pain is worse when she has been resting and gets up to move.  She has no new medications.  She did recently start back to school and her stress levels have been higher than usual.   Past Medical History:  Diagnosis Date   Allergy    Muscle strain of left upper arm 06/13/2021    Past Surgical History:  Procedure Laterality Date   CESAREAN SECTION      Family History  Problem Relation Age of Onset   Diabetes Mother    Healthy Father    Autoimmune disease Sister    Thyroid disease Sister     Social History   Socioeconomic History   Marital status: Widowed    Spouse name: Not on file   Number of children: Not on file   Years of education: Not on file   Highest education level: Not on file  Occupational History   Occupation: Pharmacist, hospital    Comment: [employer]  Tobacco Use   Smoking status: Never   Smokeless tobacco: Never  Vaping Use   Vaping Use: Never used  Substance and Sexual Activity   Alcohol use: Yes    Comment: occ   Drug use:  No   Sexual activity: Not Currently  Other Topics Concern   Not on file  Social History Narrative   Not on file   Social Determinants of Health   Financial Resource Strain: Not on file  Food Insecurity: Not on file  Transportation Needs: Not on file  Physical Activity: Not on file  Stress: Not on file  Social Connections: Not on file  Intimate Partner Violence: Not on file    Outpatient Medications Prior to Visit  Medication Sig Dispense Refill   cetirizine (ZYRTEC) 10 MG tablet TAKE 1 TABLET (10 MG TOTAL) BY MOUTH DAILY. 30 tablet 5   fluticasone (FLONASE) 50 MCG/ACT nasal spray Place 2 sprays into both nostrils daily. 16 g 6   Multiple Vitamin (MULTIVITAMIN WITH MINERALS) TABS tablet Take 1 tablet by mouth daily.     No facility-administered medications prior to  visit.    No Known Allergies  ROS Review of Systems All review of systems negative except what is listed in the HPI    Objective:    Physical Exam Vitals and nursing note reviewed.  Constitutional:      Appearance: Normal appearance.  HENT:     Head: Normocephalic.  Eyes:     Extraocular Movements: Extraocular movements intact.     Conjunctiva/sclera: Conjunctivae normal.     Pupils: Pupils are equal, round, and reactive to light.  Cardiovascular:     Rate and Rhythm: Normal rate and regular rhythm.     Pulses: Normal pulses.     Heart sounds: Normal heart sounds.  Pulmonary:     Effort: Pulmonary effort is normal.     Breath sounds: Normal breath sounds.  Musculoskeletal:        General: No swelling, tenderness or signs of injury. Normal range of motion.     Cervical back: Normal range of motion. No rigidity.     Right lower leg: No edema.     Left lower leg: No edema.  Skin:    General: Skin is warm and dry.     Capillary Refill: Capillary refill takes less than 2 seconds.  Neurological:     General: No focal deficit present.     Mental Status: She is alert and oriented to person, place, and time.     Sensory: No sensory deficit.     Motor: No weakness.     Coordination: Coordination normal.     Gait: Gait normal.  Psychiatric:        Mood and Affect: Mood normal.        Behavior: Behavior normal.        Thought Content: Thought content normal.        Judgment: Judgment normal.    BP 117/72    Pulse 88    Ht _0  (1.6 m)    Wt 195 lb 9.6 oz (88.7 kg)    SpO2 98%    BMI 34.65 kg/m  Wt Readings from Last 3 Encounters:  10/22/21 195 lb 9.6 oz (88.7 kg)  06/13/21 201 lb 6.4 oz (91.4 kg)  05/02/21 205 lb 12.8 oz (93.4 kg)     Health Maintenance Due  Topic Date Due   COVID-19 Vaccine (4 - Booster for Moderna series) 10/25/2020   COLONOSCOPY (Pts 45-51yr Insurance coverage will need to be confirmed)  Never done   MAMMOGRAM  09/23/2021    There are no  preventive care reminders to display for this patient.  Lab Results  Component Value Date  TSH 1.460 06/13/2021   Lab Results  Component Value Date   WBC 7.1 06/13/2021   HGB 13.2 06/13/2021   HCT 40.7 06/13/2021   MCV 86 06/13/2021   PLT 338 02/11/2021   Lab Results  Component Value Date   NA 140 06/13/2021   K 3.8 06/13/2021   CO2 23 06/13/2021   GLUCOSE 89 06/13/2021   BUN 12 06/13/2021   CREATININE 0.76 06/13/2021   BILITOT 0.2 06/13/2021   ALKPHOS 84 06/13/2021   AST 15 06/13/2021   ALT 16 06/13/2021   PROT 7.3 06/13/2021   ALBUMIN 4.6 06/13/2021   CALCIUM 9.5 06/13/2021   EGFR 98 06/13/2021   Lab Results  Component Value Date   CHOL 187 06/13/2021   Lab Results  Component Value Date   HDL 52 06/13/2021   Lab Results  Component Value Date   LDLCALC 109 (H) 06/13/2021   Lab Results  Component Value Date   TRIG 148 06/13/2021   Lab Results  Component Value Date   CHOLHDL 3.6 06/13/2021   Lab Results  Component Value Date   HGBA1C 5.7 (H) 06/13/2021      Assessment & Plan:   Problem List Items Addressed This Visit     Pain and numbness of upper extremity    Episodic bilateral sharp pain and numbness in hands and and forearms with sudden onset.  Coinciding with similar pain to the right lower extremity.  Etiology unknown.  Recent history of COVID and suspected herpes zoster. Zoster has healed with hypopigmentation to the areas of blisters with no other residual evidence of post herpetic neuralgia.  Labs today to r/o nutritional deficiency, inflammatory process, or diabetes. Thyroid labs taken in August with no evidence of dysfunction.  If labs normal, will consider neurology referral for EMG and evaluation. No abnormality in strength or motor function present today. No alarm symptoms.  Will follow.       Relevant Orders   CBC with Differential/Platelet   Comprehensive metabolic panel   VITAMIN D 25 Hydroxy (Vit-D Deficiency, Fractures)    Hemoglobin A1c   B12 and Folate Panel   Magnesium   Flu Vaccine QUAD 6+ mos PF IM (Fluarix Quad PF) (Completed)   Sed Rate (ESR)   Pain in right leg - Primary    Sudden onset with repeat episodes of sharp, shooting pain to the right lower extremity anteriorly moving from distal to proximal orientation.  No erythema, edema, warmth, or rash present. Skin warm and dry.  Pedal pulses intact. ROM and strength normal. No limitations to movement or sensation. No evidence of PVD present.  Etiology unclear at this time. Does not appear to be coming from the back and no injury's present. Coinciding episodic bilateral arm pain and numbness is also present.  Will obtain labs for evaluation of electrolytes, blood glucose, kidney, and liver function as well as inflammatory markers. Recent thyroid labs normal.  No red flags present today.  If labs normal, will consider neurology evaluation and possible EMG.       Relevant Orders   CBC with Differential/Platelet   Comprehensive metabolic panel   VITAMIN D 25 Hydroxy (Vit-D Deficiency, Fractures)   Hemoglobin A1c   B12 and Folate Panel   Magnesium   Flu Vaccine QUAD 6+ mos PF IM (Fluarix Quad PF) (Completed)   Sed Rate (ESR)    No orders of the defined types were placed in this encounter.   Follow-up: Return for TBD based on labs.  Tanya Render, NP

## 2021-10-22 NOTE — Assessment & Plan Note (Addendum)
Sudden onset with repeat episodes of sharp, shooting pain to the right lower extremity anteriorly moving from distal to proximal orientation.  No erythema, edema, warmth, or rash present. Skin warm and dry.  Pedal pulses intact. ROM and strength normal. No limitations to movement or sensation. No evidence of PVD present.  Etiology unclear at this time. Does not appear to be coming from the back and no injury's present. Coinciding episodic bilateral arm pain and numbness is also present.  Will obtain labs for evaluation of electrolytes, blood glucose, kidney, and liver function as well as inflammatory markers. Recent thyroid labs normal.  No red flags present today.  If labs normal, will consider neurology evaluation and possible EMG.

## 2021-10-23 LAB — CBC WITH DIFFERENTIAL/PLATELET
Basophils Absolute: 0 10*3/uL (ref 0.0–0.2)
Basos: 1 %
EOS (ABSOLUTE): 0.3 10*3/uL (ref 0.0–0.4)
Eos: 5 %
Hematocrit: 41.3 % (ref 34.0–46.6)
Hemoglobin: 13.1 g/dL (ref 11.1–15.9)
Immature Grans (Abs): 0 10*3/uL (ref 0.0–0.1)
Immature Granulocytes: 0 %
Lymphocytes Absolute: 1.9 10*3/uL (ref 0.7–3.1)
Lymphs: 31 %
MCH: 28.1 pg (ref 26.6–33.0)
MCHC: 31.7 g/dL (ref 31.5–35.7)
MCV: 89 fL (ref 79–97)
Monocytes Absolute: 0.5 10*3/uL (ref 0.1–0.9)
Monocytes: 9 %
Neutrophils Absolute: 3.3 10*3/uL (ref 1.4–7.0)
Neutrophils: 54 %
Platelets: 292 10*3/uL (ref 150–450)
RBC: 4.66 x10E6/uL (ref 3.77–5.28)
RDW: 12.6 % (ref 11.7–15.4)
WBC: 6.1 10*3/uL (ref 3.4–10.8)

## 2021-10-23 LAB — COMPREHENSIVE METABOLIC PANEL
ALT: 16 IU/L (ref 0–32)
AST: 14 IU/L (ref 0–40)
Albumin/Globulin Ratio: 1.8 (ref 1.2–2.2)
Albumin: 4.4 g/dL (ref 3.8–4.8)
Alkaline Phosphatase: 73 IU/L (ref 44–121)
BUN/Creatinine Ratio: 17 (ref 9–23)
BUN: 13 mg/dL (ref 6–24)
Bilirubin Total: <0.2 mg/dL (ref 0.0–1.2)
CO2: 25 mmol/L (ref 20–29)
Calcium: 9.4 mg/dL (ref 8.7–10.2)
Chloride: 105 mmol/L (ref 96–106)
Creatinine, Ser: 0.78 mg/dL (ref 0.57–1.00)
Globulin, Total: 2.4 g/dL (ref 1.5–4.5)
Glucose: 100 mg/dL — ABNORMAL HIGH (ref 70–99)
Potassium: 4.7 mmol/L (ref 3.5–5.2)
Sodium: 141 mmol/L (ref 134–144)
Total Protein: 6.8 g/dL (ref 6.0–8.5)
eGFR: 95 mL/min/{1.73_m2} (ref >59)

## 2021-10-23 LAB — MAGNESIUM: Magnesium: 1.9 mg/dL (ref 1.6–2.3)

## 2021-10-23 LAB — B12 AND FOLATE PANEL
Folate: 3.2 ng/mL (ref >3.0)
Vitamin B-12: 622 pg/mL (ref 232–1245)

## 2021-10-23 LAB — HEMOGLOBIN A1C
Est. average glucose Bld gHb Est-mCnc: 105 mg/dL
Hgb A1c MFr Bld: 5.3 % (ref 4.8–5.6)

## 2021-10-23 LAB — SEDIMENTATION RATE: Sed Rate: 7 mm/hr (ref 0–32)

## 2021-10-23 LAB — VITAMIN D 25 HYDROXY (VIT D DEFICIENCY, FRACTURES): Vit D, 25-Hydroxy: 23.3 ng/mL — ABNORMAL LOW (ref 30.0–100.0)

## 2021-10-24 ENCOUNTER — Other Ambulatory Visit (HOSPITAL_BASED_OUTPATIENT_CLINIC_OR_DEPARTMENT_OTHER): Payer: Self-pay | Admitting: Nurse Practitioner

## 2021-10-24 DIAGNOSIS — E559 Vitamin D deficiency, unspecified: Secondary | ICD-10-CM

## 2021-10-24 MED ORDER — VITAMIN D (ERGOCALCIFEROL) 1.25 MG (50000 UNIT) PO CAPS: 50000.0000 [IU] | ORAL_CAPSULE | ORAL | 0 refills | Status: DC

## 2021-10-30 DIAGNOSIS — M9903 Segmental and somatic dysfunction of lumbar region: Secondary | ICD-10-CM | POA: Diagnosis not present

## 2021-10-30 DIAGNOSIS — M9901 Segmental and somatic dysfunction of cervical region: Secondary | ICD-10-CM | POA: Diagnosis not present

## 2021-10-30 DIAGNOSIS — M62830 Muscle spasm of back: Secondary | ICD-10-CM | POA: Diagnosis not present

## 2021-10-30 DIAGNOSIS — M9902 Segmental and somatic dysfunction of thoracic region: Secondary | ICD-10-CM | POA: Diagnosis not present

## 2021-11-10 DIAGNOSIS — M9903 Segmental and somatic dysfunction of lumbar region: Secondary | ICD-10-CM | POA: Diagnosis not present

## 2021-11-10 DIAGNOSIS — M9901 Segmental and somatic dysfunction of cervical region: Secondary | ICD-10-CM | POA: Diagnosis not present

## 2021-11-10 DIAGNOSIS — M9902 Segmental and somatic dysfunction of thoracic region: Secondary | ICD-10-CM | POA: Diagnosis not present

## 2021-11-10 DIAGNOSIS — M62830 Muscle spasm of back: Secondary | ICD-10-CM | POA: Diagnosis not present

## 2021-11-24 DIAGNOSIS — M9902 Segmental and somatic dysfunction of thoracic region: Secondary | ICD-10-CM | POA: Diagnosis not present

## 2021-11-24 DIAGNOSIS — M62830 Muscle spasm of back: Secondary | ICD-10-CM | POA: Diagnosis not present

## 2021-11-24 DIAGNOSIS — M9903 Segmental and somatic dysfunction of lumbar region: Secondary | ICD-10-CM | POA: Diagnosis not present

## 2021-11-24 DIAGNOSIS — M9901 Segmental and somatic dysfunction of cervical region: Secondary | ICD-10-CM | POA: Diagnosis not present

## 2021-12-10 ENCOUNTER — Encounter (HOSPITAL_BASED_OUTPATIENT_CLINIC_OR_DEPARTMENT_OTHER): Payer: Self-pay | Admitting: Family Medicine

## 2021-12-10 ENCOUNTER — Ambulatory Visit (INDEPENDENT_AMBULATORY_CARE_PROVIDER_SITE_OTHER): Payer: BC Managed Care – PPO | Admitting: Family Medicine

## 2021-12-10 ENCOUNTER — Other Ambulatory Visit: Payer: Self-pay

## 2021-12-10 DIAGNOSIS — J019 Acute sinusitis, unspecified: Secondary | ICD-10-CM | POA: Diagnosis not present

## 2021-12-10 DIAGNOSIS — J329 Chronic sinusitis, unspecified: Secondary | ICD-10-CM | POA: Insufficient documentation

## 2021-12-10 NOTE — Assessment & Plan Note (Signed)
Reports that about 3 to 4 days ago she began to have symptoms including sinus congestion, pressure, rhinorrhea, headache.  Symptoms have generally persisted since they started.  She did complete an at-home COVID test today which was negative.  She has been trying over-the-counter medications to help with symptoms including Alka-Seltzer cold and flu.  She denies any fever, body ache, shortness of breath.  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 Suspect viral etiology for current symptoms, less likely coronavirus given recent negative test at home, less likely influenza based on symptoms described Recommend continuing with conservative measures, did discuss using nasal saline spray, intranasal steroid spray, ensuring adequate hydration, getting adequate rest Discussed expectation that symptoms should gradually improve over the next few days, if she does note subsequent worsening thereafter, this could be a sign of a bacterial infection and thus advised that she contact the office if she does notice any worsening of symptoms

## 2021-12-10 NOTE — Progress Notes (Signed)
° °  Virtual Visit via Telephone   I connected with  Tanya Mccoy  on 12/10/21 by telephone/telehealth and verified that I am speaking with the correct person using two identifiers.   I discussed the limitations, risks, security and privacy concerns of performing an evaluation and management service by telephone, including the higher likelihood of inaccurate diagnosis and treatment, and the availability of in person appointments.  We also discussed the likely need of an additional face to face encounter for complete and high quality delivery of care.  I also discussed with the patient that there may be a patient responsible charge related to this service. The patient expressed understanding and wishes to proceed.  Provider location is in medical facility. Patient location is at their home, different from provider location. People involved in care of the patient during this telehealth encounter were myself, my nurse/medical assistant, and my front office/scheduling team member.  Review of Systems: No fevers, chills, night sweats, weight loss, chest pain, or shortness of breath.   Objective Findings:    General: Speaking full sentences, no audible heavy breathing.  Sounds alert and appropriately interactive.    Independent interpretation of tests performed by another provider:   None.  Brief History, Exam, Impression, and Recommendations:    Sinusitis Reports that about 3 to 4 days ago she began to have symptoms including sinus congestion, pressure, rhinorrhea, headache.  Symptoms have generally persisted since they started.  She did complete an at-home COVID test today which was negative.  She has been trying over-the-counter medications to help with symptoms including Alka-Seltzer cold and flu.  She denies any fever, body ache, shortness of breath.  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 Suspect viral etiology for current symptoms, less likely coronavirus given recent negative test at home, less likely influenza based on symptoms described Recommend continuing with conservative measures, did discuss using nasal saline spray, intranasal steroid spray, ensuring adequate hydration, getting adequate rest Discussed expectation that symptoms should gradually improve over the next few days, if she does note subsequent worsening thereafter, this could be a sign of a bacterial infection and thus advised that she contact the office if she does notice any worsening of symptoms  I discussed the above assessment and treatment plan with the patient. The patient was provided an opportunity to ask questions and all were answered. The patient agreed with the plan and demonstrated an understanding of the instructions.   The patient was advised to call back or seek an in-person evaluation if the symptoms worsen or if the condition fails to improve as anticipated.   I provided 15 minutes of face to face and non-face-to-face time during this encounter date, time was needed to gather information, review chart, records, communicate/coordinate with staff remotely, as well as complete documentation.   ___________________________________________ Deanndra Kirley de [place], MD, ABFM, CAQSM [facility] [place]

## 2021-12-29 DIAGNOSIS — M62830 Muscle spasm of back: Secondary | ICD-10-CM | POA: Diagnosis not present

## 2021-12-29 DIAGNOSIS — M9901 Segmental and somatic dysfunction of cervical region: Secondary | ICD-10-CM | POA: Diagnosis not present

## 2021-12-29 DIAGNOSIS — M9903 Segmental and somatic dysfunction of lumbar region: Secondary | ICD-10-CM | POA: Diagnosis not present

## 2021-12-29 DIAGNOSIS — M9902 Segmental and somatic dysfunction of thoracic region: Secondary | ICD-10-CM | POA: Diagnosis not present

## 2022-01-12 DIAGNOSIS — M9902 Segmental and somatic dysfunction of thoracic region: Secondary | ICD-10-CM | POA: Diagnosis not present

## 2022-01-12 DIAGNOSIS — M9901 Segmental and somatic dysfunction of cervical region: Secondary | ICD-10-CM | POA: Diagnosis not present

## 2022-01-12 DIAGNOSIS — M62830 Muscle spasm of back: Secondary | ICD-10-CM | POA: Diagnosis not present

## 2022-01-12 DIAGNOSIS — M9903 Segmental and somatic dysfunction of lumbar region: Secondary | ICD-10-CM | POA: Diagnosis not present

## 2022-01-17 IMAGING — US US PELVIS COMPLETE WITH TRANSVAGINAL
1 series · 14 of 25 positions shown · non-contrast
Comparison: None

CLINICAL DATA: Initial evaluation for acute pelvic pain for 1
month.



[Series 1: us pelvic complete with transvaginal · 14 of 83 slices shown]
[im 1/83]
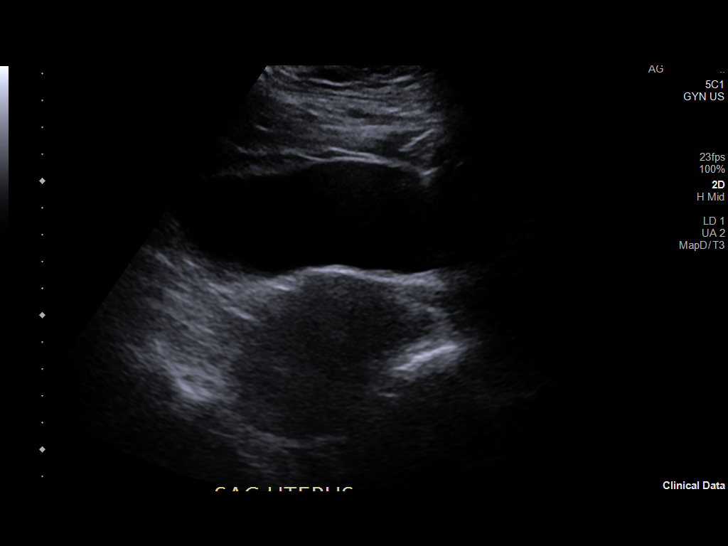
[im 7/83]
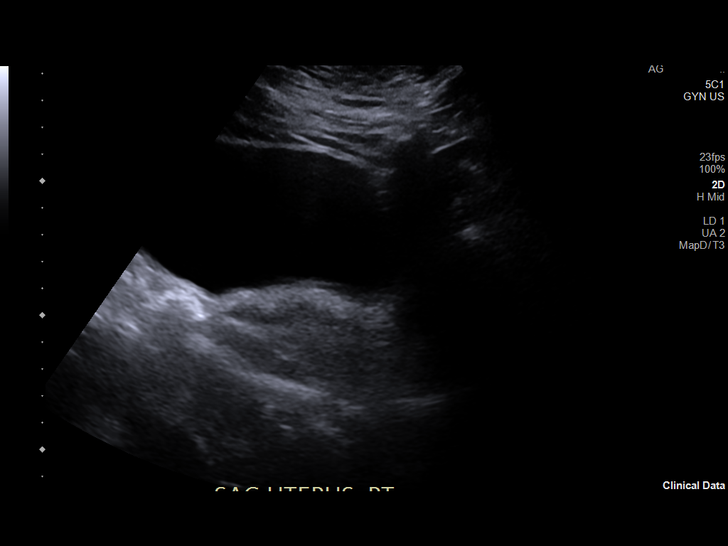
[im 14/83]
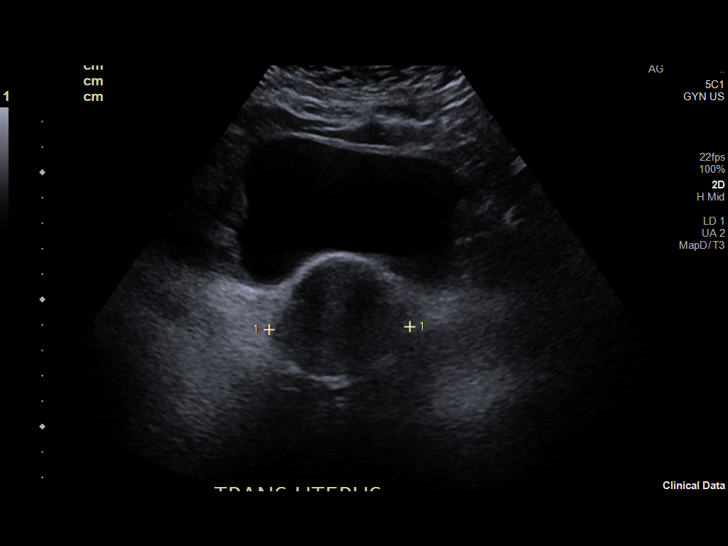
[im 21/83]
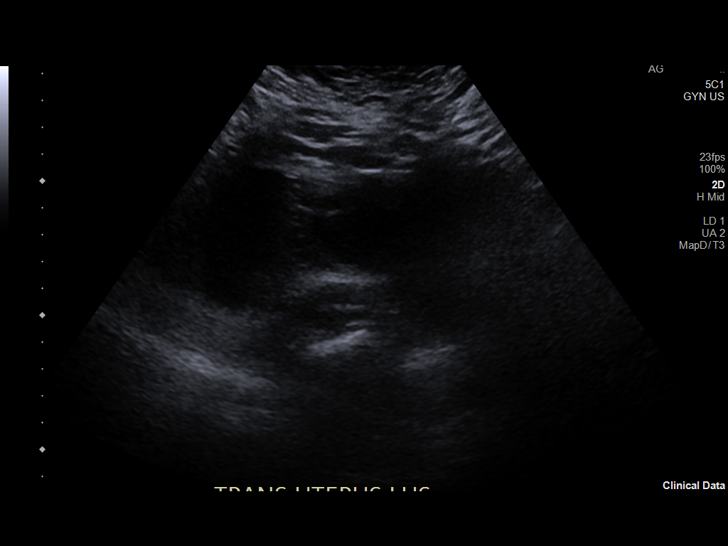
[im 28/83]
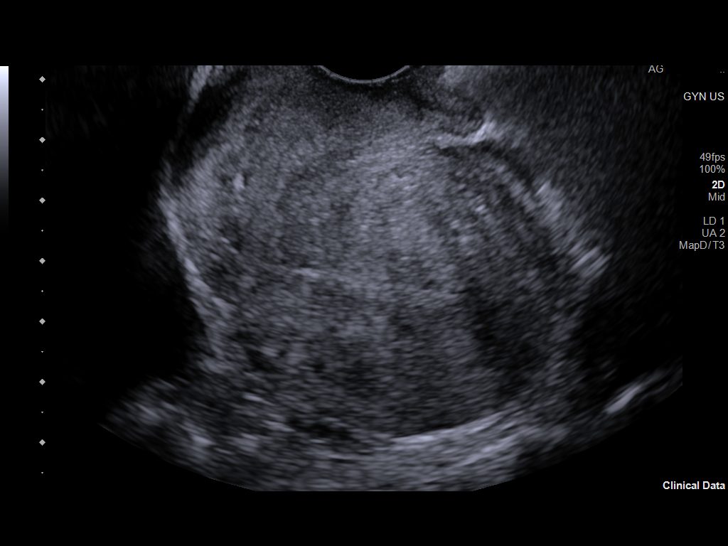
[im 31/83]
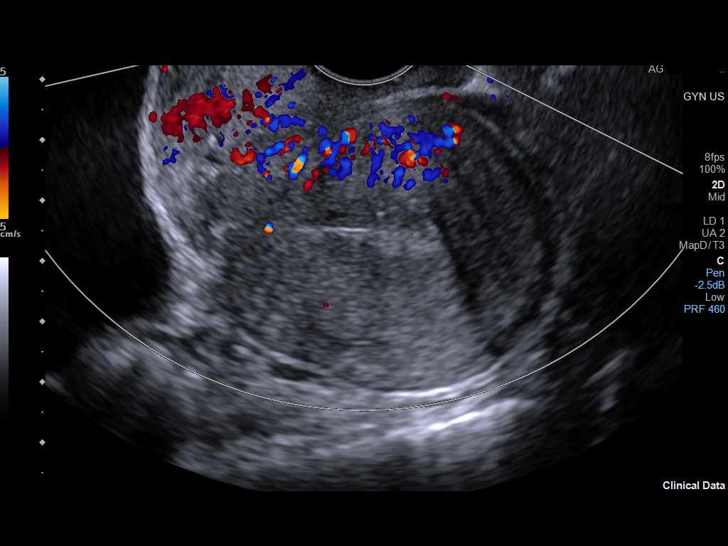
[im 38/83]
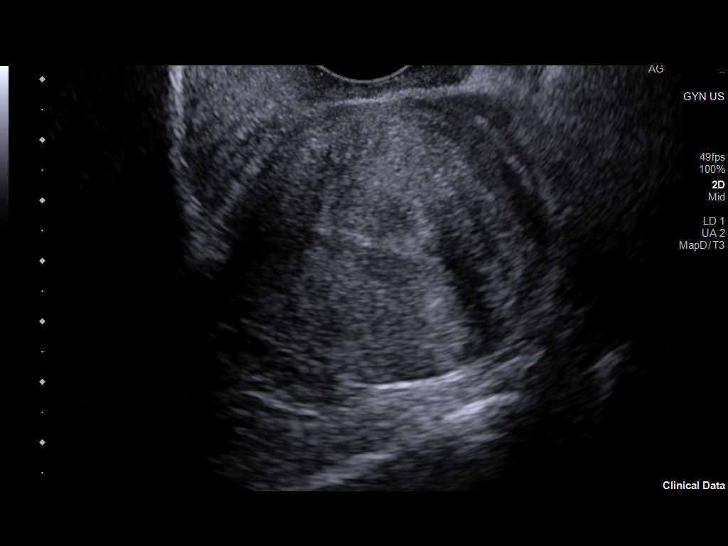
[im 45/83]
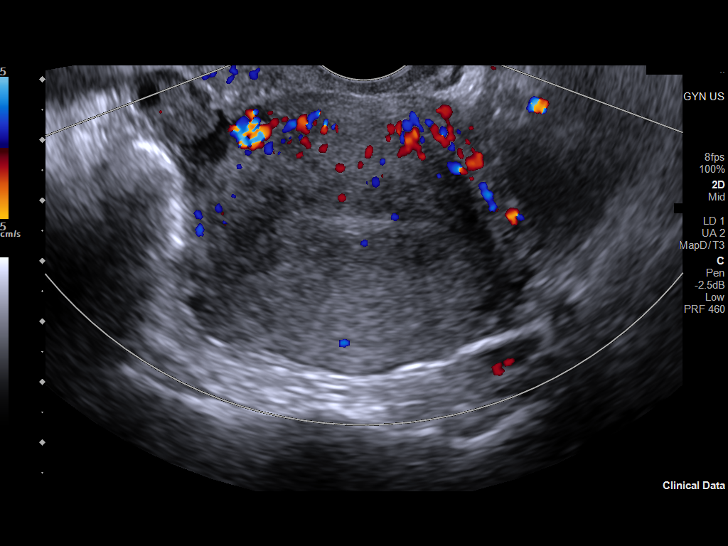
[im 52/83]
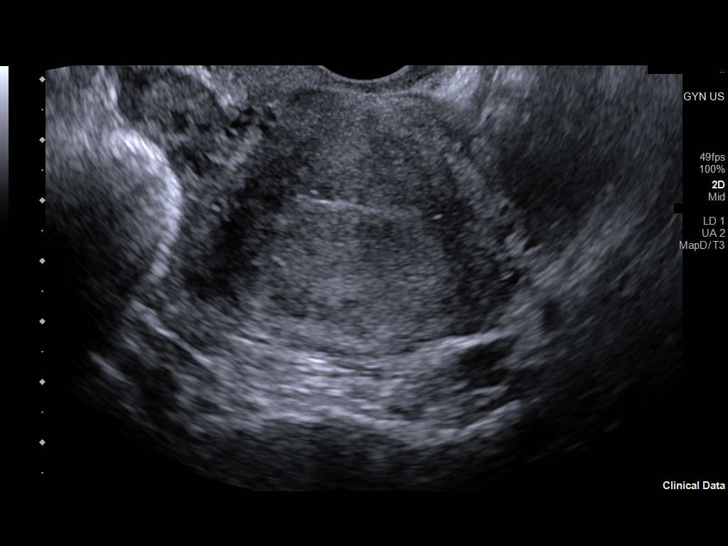
[im 55/83]
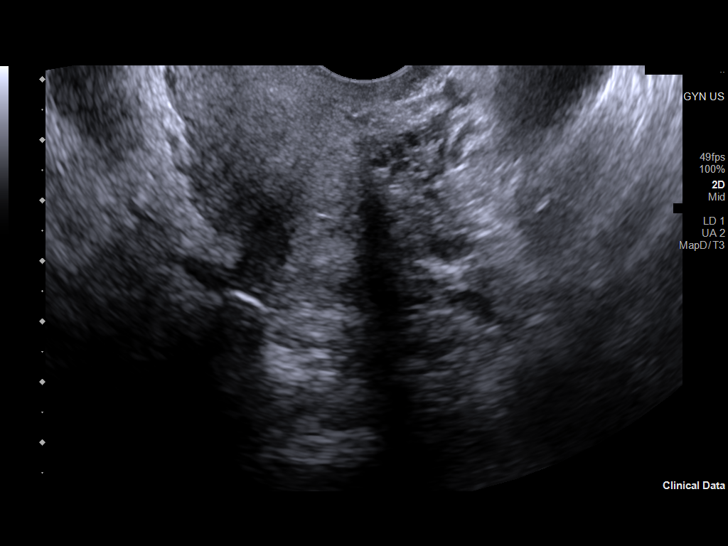
[im 62/83]
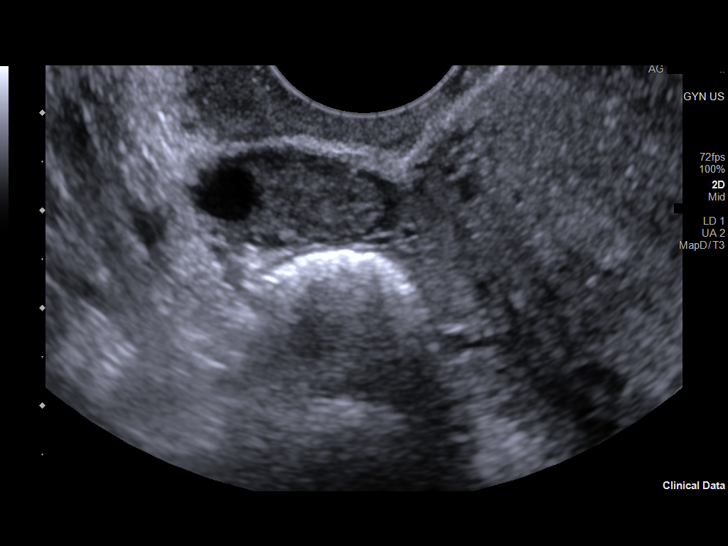
[im 69/83]
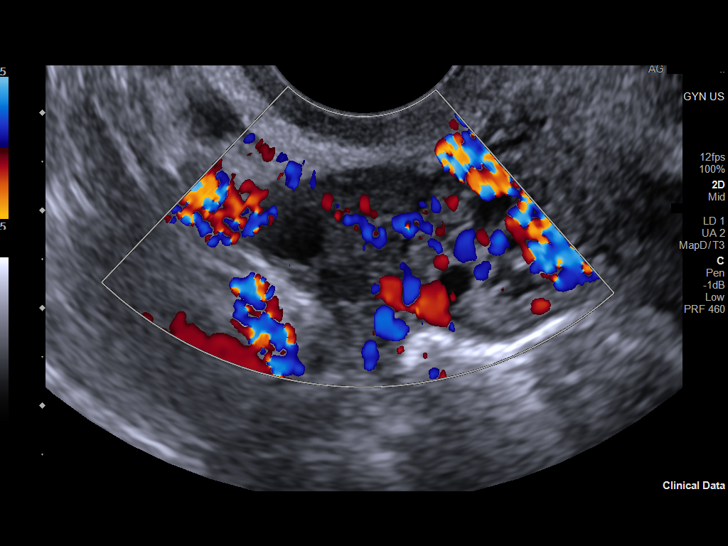
[im 76/83]
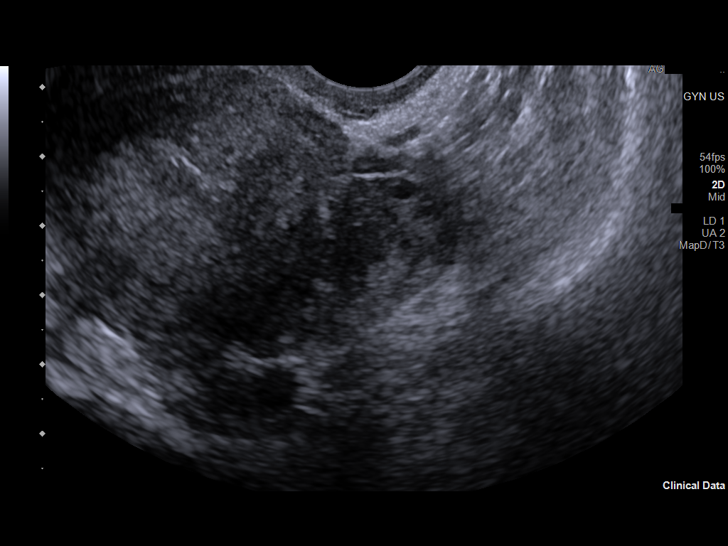
[im 83/83]
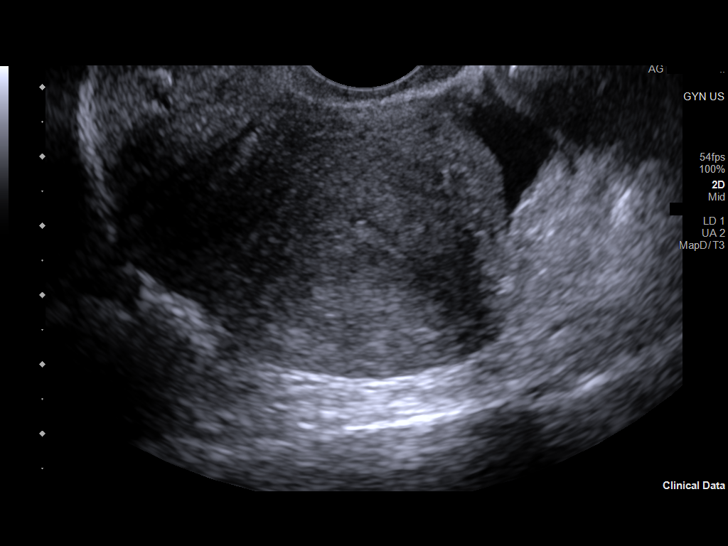

[14 of 25 positions shown; findings below may reference images not displayed]

FINDINGS: Uterus

Measurements: 7.3 x 5.0 x 5.9 cm = volume: 112.1 mL. Uterus is
retroflexed. No fibroids or other mass visualized.

Endometrium

Thickness: 2.9 mm.  No focal abnormality visualized.

Right ovary

Measurements: 1.9 x 1.0 x 1.8 cm = volume: 1.8 mL. Normal
appearance/no adnexal mass.

Left ovary

Measurements: 2.7 x 2.0 x 1.7 cm = volume: 4.9 mL. Normal
appearance/no adnexal mass.

Other findings

Small volume free fluid within the pelvis, presumably physiologic.
IMPRESSION: Normal pelvic ultrasound. No findings to explain patient's symptoms
identified.

## 2022-02-23 DIAGNOSIS — M9903 Segmental and somatic dysfunction of lumbar region: Secondary | ICD-10-CM | POA: Diagnosis not present

## 2022-02-23 DIAGNOSIS — M62830 Muscle spasm of back: Secondary | ICD-10-CM | POA: Diagnosis not present

## 2022-02-23 DIAGNOSIS — M9902 Segmental and somatic dysfunction of thoracic region: Secondary | ICD-10-CM | POA: Diagnosis not present

## 2022-02-23 DIAGNOSIS — M9901 Segmental and somatic dysfunction of cervical region: Secondary | ICD-10-CM | POA: Diagnosis not present

## 2022-03-11 DIAGNOSIS — M9903 Segmental and somatic dysfunction of lumbar region: Secondary | ICD-10-CM | POA: Diagnosis not present

## 2022-03-11 DIAGNOSIS — M9901 Segmental and somatic dysfunction of cervical region: Secondary | ICD-10-CM | POA: Diagnosis not present

## 2022-03-11 DIAGNOSIS — M62830 Muscle spasm of back: Secondary | ICD-10-CM | POA: Diagnosis not present

## 2022-03-11 DIAGNOSIS — M9902 Segmental and somatic dysfunction of thoracic region: Secondary | ICD-10-CM | POA: Diagnosis not present

## 2022-03-24 ENCOUNTER — Other Ambulatory Visit (HOSPITAL_BASED_OUTPATIENT_CLINIC_OR_DEPARTMENT_OTHER): Payer: Self-pay | Admitting: Nurse Practitioner

## 2022-03-24 DIAGNOSIS — Z1231 Encounter for screening mammogram for malignant neoplasm of breast: Secondary | ICD-10-CM

## 2022-03-27 ENCOUNTER — Ambulatory Visit (HOSPITAL_BASED_OUTPATIENT_CLINIC_OR_DEPARTMENT_OTHER)
Admission: RE | Admit: 2022-03-27 | Discharge: 2022-03-27 | Disposition: A | Discharge status: Home / Self Care | Payer: BC Managed Care – PPO | Source: Ambulatory Visit | Attending: Nurse Practitioner | Admitting: Nurse Practitioner

## 2022-03-27 ENCOUNTER — Encounter (HOSPITAL_BASED_OUTPATIENT_CLINIC_OR_DEPARTMENT_OTHER): Payer: Self-pay | Admitting: Radiology

## 2022-03-27 DIAGNOSIS — Z1231 Encounter for screening mammogram for malignant neoplasm of breast: Secondary | ICD-10-CM | POA: Diagnosis not present

## 2022-03-30 DIAGNOSIS — M9902 Segmental and somatic dysfunction of thoracic region: Secondary | ICD-10-CM | POA: Diagnosis not present

## 2022-03-30 DIAGNOSIS — M9901 Segmental and somatic dysfunction of cervical region: Secondary | ICD-10-CM | POA: Diagnosis not present

## 2022-03-30 DIAGNOSIS — M62830 Muscle spasm of back: Secondary | ICD-10-CM | POA: Diagnosis not present

## 2022-03-30 DIAGNOSIS — M9903 Segmental and somatic dysfunction of lumbar region: Secondary | ICD-10-CM | POA: Diagnosis not present

## 2022-04-02 ENCOUNTER — Ambulatory Visit (HOSPITAL_BASED_OUTPATIENT_CLINIC_OR_DEPARTMENT_OTHER): Payer: BC Managed Care – PPO | Admitting: Nurse Practitioner

## 2022-04-06 ENCOUNTER — Ambulatory Visit (INDEPENDENT_AMBULATORY_CARE_PROVIDER_SITE_OTHER): Payer: BC Managed Care – PPO | Admitting: Nurse Practitioner

## 2022-04-06 ENCOUNTER — Encounter (HOSPITAL_BASED_OUTPATIENT_CLINIC_OR_DEPARTMENT_OTHER): Payer: Self-pay | Admitting: Nurse Practitioner

## 2022-04-06 VITALS — BP 112/72 | HR 81 | Ht 63.0 in | Wt 196.0 lb

## 2022-04-06 DIAGNOSIS — T7840XA Allergy, unspecified, initial encounter: Secondary | ICD-10-CM

## 2022-04-06 DIAGNOSIS — N946 Dysmenorrhea, unspecified: Secondary | ICD-10-CM | POA: Diagnosis not present

## 2022-04-06 DIAGNOSIS — N926 Irregular menstruation, unspecified: Secondary | ICD-10-CM

## 2022-04-06 DIAGNOSIS — R21 Rash and other nonspecific skin eruption: Secondary | ICD-10-CM

## 2022-04-06 MED ORDER — BETAMETHASONE DIPROPIONATE 0.05 % EX CREA: TOPICAL_CREAM | Freq: Two times a day (BID) | CUTANEOUS | 3 refills | Status: DC

## 2022-04-06 MED ORDER — NORETHINDRONE 0.35 MG PO TABS: 1.0000 | ORAL_TABLET | Freq: Every day | ORAL | 3 refills | Status: DC

## 2022-04-06 NOTE — Progress Notes (Incomplete)
  Worthy Keeler, DNP, AGNP-c [facility] [address] [place], [place] [address] [phone] Office [phone] Fax  ESTABLISHED PATIENT- Chronic Health and/or Follow-Up Visit  Last menstrual period 03/04/2022.  No chief complaint on file.   HPI  Tanya Mccoy  is a 46 y.o. year old female presenting today for evaluation and management of the following: Menstrual Cycles For the past few months very heavy and painful cycles She has not been on OCP for the past 6 years   Rash in axilla Used new deoderant and developed rash and blisters under the arms   ROS All ROS negative with exception of what is listed in HPI  PHYSICAL EXAM Physical Exam  ASSESSMENT & PLAN Problem List Items Addressed This Visit   None    FOLLOW-UP No follow-ups on file.  Time: ***minutes, >50% spent counseling, care coordination, chart review, and documentation.   Worthy Keeler, DNP, AGNP-c

## 2022-04-06 NOTE — Patient Instructions (Signed)
I have sent in the progesterone only pill for you to start to help control your periods. If this does not seem to work, please let me know and we can switch this.

## 2022-04-06 NOTE — Progress Notes (Unsigned)
  Worthy Keeler, DNP, AGNP-c [facility] [address] [place], [place] [address] [phone] Office [phone] Fax  ESTABLISHED PATIENT- Chronic Health and/or Follow-Up Visit  Last menstrual period 03/04/2022.  No chief complaint on file.   HPI  Tanya Mccoy  is a 46 y.o. year old female presenting today for evaluation and management of the following:   ROS All ROS negative with exception of what is listed in HPI  PHYSICAL EXAM Physical Exam  ASSESSMENT & PLAN Problem List Items Addressed This Visit   None    FOLLOW-UP No follow-ups on file.  Time: ***minutes, >50% spent counseling, care coordination, chart review, and documentation.   Worthy Keeler, DNP, AGNP-c

## 2022-04-09 ENCOUNTER — Telehealth (HOSPITAL_BASED_OUTPATIENT_CLINIC_OR_DEPARTMENT_OTHER): Payer: Self-pay | Admitting: Nurse Practitioner

## 2022-04-09 DIAGNOSIS — T7840XA Allergy, unspecified, initial encounter: Secondary | ICD-10-CM

## 2022-04-09 NOTE — Telephone Encounter (Signed)
Received fax transmissio n from [employer] company denying coverage for betamethasone dipropionate 0.05 % cream. Documents will be in provider's yellow dot tray for review. Please advise.

## 2022-04-13 NOTE — Telephone Encounter (Signed)
Outcome unfavorable

## 2022-04-14 NOTE — Telephone Encounter (Signed)
Fax denial sent to office with details of decision. Insurance plan would consider approval for this prescription if patient tries and fails with betamethason calerate cream, and/or triamcinolone acetonide cream

## 2022-04-15 MED ORDER — TRIAMCINOLONE ACETONIDE 0.1 % EX CREA: 1.0000 | TOPICAL_CREAM | Freq: Two times a day (BID) | CUTANEOUS | 2 refills | Status: DC

## 2022-04-18 DIAGNOSIS — T7840XA Allergy, unspecified, initial encounter: Secondary | ICD-10-CM | POA: Insufficient documentation

## 2022-04-18 DIAGNOSIS — N926 Irregular menstruation, unspecified: Secondary | ICD-10-CM | POA: Insufficient documentation

## 2022-04-18 DIAGNOSIS — N946 Dysmenorrhea, unspecified: Secondary | ICD-10-CM | POA: Insufficient documentation

## 2022-04-21 ENCOUNTER — Telehealth (HOSPITAL_BASED_OUTPATIENT_CLINIC_OR_DEPARTMENT_OTHER): Payer: Self-pay

## 2022-04-21 NOTE — Telephone Encounter (Signed)
Erroneous

## 2022-05-07 DIAGNOSIS — M62830 Muscle spasm of back: Secondary | ICD-10-CM | POA: Diagnosis not present

## 2022-05-07 DIAGNOSIS — M9901 Segmental and somatic dysfunction of cervical region: Secondary | ICD-10-CM | POA: Diagnosis not present

## 2022-05-07 DIAGNOSIS — M9902 Segmental and somatic dysfunction of thoracic region: Secondary | ICD-10-CM | POA: Diagnosis not present

## 2022-05-07 DIAGNOSIS — M9903 Segmental and somatic dysfunction of lumbar region: Secondary | ICD-10-CM | POA: Diagnosis not present

## 2022-05-18 DIAGNOSIS — H5213 Myopia, bilateral: Secondary | ICD-10-CM | POA: Diagnosis not present

## 2022-05-25 DIAGNOSIS — M9902 Segmental and somatic dysfunction of thoracic region: Secondary | ICD-10-CM | POA: Diagnosis not present

## 2022-05-25 DIAGNOSIS — M9903 Segmental and somatic dysfunction of lumbar region: Secondary | ICD-10-CM | POA: Diagnosis not present

## 2022-05-25 DIAGNOSIS — M62830 Muscle spasm of back: Secondary | ICD-10-CM | POA: Diagnosis not present

## 2022-05-25 DIAGNOSIS — M9901 Segmental and somatic dysfunction of cervical region: Secondary | ICD-10-CM | POA: Diagnosis not present

## 2022-06-15 DIAGNOSIS — M9902 Segmental and somatic dysfunction of thoracic region: Secondary | ICD-10-CM | POA: Diagnosis not present

## 2022-06-15 DIAGNOSIS — M9903 Segmental and somatic dysfunction of lumbar region: Secondary | ICD-10-CM | POA: Diagnosis not present

## 2022-06-15 DIAGNOSIS — M9901 Segmental and somatic dysfunction of cervical region: Secondary | ICD-10-CM | POA: Diagnosis not present

## 2022-06-15 DIAGNOSIS — M62830 Muscle spasm of back: Secondary | ICD-10-CM | POA: Diagnosis not present

## 2022-06-16 ENCOUNTER — Ambulatory Visit (HOSPITAL_BASED_OUTPATIENT_CLINIC_OR_DEPARTMENT_OTHER): Payer: BC Managed Care – PPO | Admitting: Nurse Practitioner

## 2022-06-18 ENCOUNTER — Encounter (HOSPITAL_BASED_OUTPATIENT_CLINIC_OR_DEPARTMENT_OTHER): Payer: Self-pay | Admitting: Nurse Practitioner

## 2022-06-18 ENCOUNTER — Ambulatory Visit (INDEPENDENT_AMBULATORY_CARE_PROVIDER_SITE_OTHER): Payer: BC Managed Care – PPO | Admitting: Nurse Practitioner

## 2022-06-18 VITALS — BP 119/55 | HR 92 | Ht 63.0 in | Wt 193.2 lb

## 2022-06-18 DIAGNOSIS — B359 Dermatophytosis, unspecified: Secondary | ICD-10-CM | POA: Diagnosis not present

## 2022-06-18 MED ORDER — TRIAMCINOLONE ACETONIDE 0.1 % EX CREA: TOPICAL_CREAM | Freq: Two times a day (BID) | CUTANEOUS | 3 refills | Status: DC

## 2022-06-18 NOTE — Progress Notes (Signed)
  Tollie Eth, DNP, AGNP-c [facility] [address]  [address] [place], [place] [address] [phone]  Subjective:   Tanya Mccoy is a 46 y.o. female presents to day for itching. Pruritis Tanya Mccoy endorses itching on her back for appx 1-2 months.  She endorses the area most intense is located under the band of her bra She has not tried anything other than lotion She denies any drainage or bleeding. No changes in soap, detergents, lotions. No known contact with allergen.   PMH, Medications, and Allergies reviewed and updated in chart.   ROS negative except for what is listed in HPI. Objective:  BP (!) 119/55   Pulse 92   Ht 5\' 3"  (1.6 m)   Wt 193 lb 3.2 oz (87.6 kg)   SpO2 99%   BMI 34.22 kg/m  Physical Exam Vitals and nursing note reviewed.  Constitutional:      Appearance: Normal appearance.  Cardiovascular:     Rate and Rhythm: Normal rate and regular rhythm.     Pulses: Normal pulses.     Heart sounds: Normal heart sounds.  Pulmonary:     Effort: Pulmonary effort is normal.     Breath sounds: Normal breath sounds.  Skin:    General: Skin is warm and dry.     Findings: Erythema and lesion present.     Comments: Skin to the posterior torso shows scattered erythematous discoid lesions with central clearing.  Mildly papular in texture with no evidence of drainage or excoriation. Concentration noted in the area under the band of the bra.   Neurological:     General: No focal deficit present.     Mental Status: She is alert.  Psychiatric:        Mood and Affect: Mood normal.        Behavior: Behavior normal.        Thought Content: Thought content normal.        Judgment: Judgment normal.           Assessment & Plan:   1. Tinea Symptoms and presentation consistent with tinea infection to the skin. No signs of infection or drainage present. Will send treatment today. Recommend follow-up symptoms do not  improve in the next 1-2 weeks with consistent use of medication.  - ketoconazole 2%-triamcinolone 0.1% 1:2 cream mixture; Apply topically 2 (two) times daily.  Dispense: 45 g; Refill: 3    , DNP, AGNP-c 07/19/2022  7:59 PM

## 2022-06-18 NOTE — Patient Instructions (Signed)
Please let me know if this doesn't look or feel any better in the next week to 10 days.

## 2022-08-22 ENCOUNTER — Other Ambulatory Visit: Payer: Self-pay

## 2022-08-22 ENCOUNTER — Ambulatory Visit
Admission: RE | Admit: 2022-08-22 | Discharge: 2022-08-22 | Disposition: A | Discharge status: Home / Self Care | Payer: BC Managed Care – PPO | Source: Ambulatory Visit | Attending: Urgent Care | Admitting: Urgent Care

## 2022-08-22 VITALS — BP 110/73 | HR 98 | Temp 99.0°F | Resp 16

## 2022-08-22 DIAGNOSIS — J309 Allergic rhinitis, unspecified: Secondary | ICD-10-CM

## 2022-08-22 DIAGNOSIS — J018 Other acute sinusitis: Secondary | ICD-10-CM

## 2022-08-22 MED ORDER — AMOXICILLIN 875 MG PO TABS: 875.0000 mg | ORAL_TABLET | Freq: Two times a day (BID) | ORAL | 0 refills | Status: DC

## 2022-08-22 MED ORDER — CETIRIZINE HCL 10 MG PO TABS: 10.0000 mg | ORAL_TABLET | Freq: Every day | ORAL | 0 refills | Status: DC

## 2022-08-22 MED ORDER — PROMETHAZINE-DM 6.25-15 MG/5ML PO SYRP: 2.5000 mL | ORAL_SOLUTION | Freq: Three times a day (TID) | ORAL | 0 refills | Status: DC | PRN

## 2022-08-22 MED ORDER — PSEUDOEPHEDRINE HCL 60 MG PO TABS: 60.0000 mg | ORAL_TABLET | Freq: Three times a day (TID) | ORAL | 0 refills | Status: DC | PRN

## 2022-08-22 NOTE — ED Provider Notes (Signed)
[facility]  Note:  This document was prepared using Systems analyst and may include unintentional dictation errors.  MRN: [id number] DOB: January 12, 1976  Subjective:   Tanya Mccoy is a 46 y.o. female presenting for 1 week history of persistent and worsening productive cough, congestion, throat pain, body aches, subjective fever, malaise and fatigue.  She has had some intermittent shortness of breath and chest pain from her coughing.  Patient has been using multiple over-the-counter medications.  She was taking her allergy medication and ran out 2 days ago.  Requested refill.   No smoking.  No history of asthma.  Patient did a COVID test at home and was negative.  No current facility-administered medications for this encounter.  Current Outpatient Medications:    Multiple Vitamin (MULTIVITAMIN WITH MINERALS) TABS tablet, Take 1 tablet by mouth daily., Disp: , Rfl:    betamethasone dipropionate 0.05 % cream, Apply topically 2 (two) times daily. To affected area(s) as needed, Disp: 45 g, Rfl: 3   cetirizine (ZYRTEC) 10 MG tablet, TAKE 1 TABLET (10 MG TOTAL) BY MOUTH DAILY., Disp: 30 tablet, Rfl: 5   fluticasone (FLONASE) 50 MCG/ACT nasal spray, Place 2 sprays into both nostrils daily., Disp: 16 g, Rfl: 6   ketoconazole 2%-triamcinolone 0.1% 1:2 cream mixture, Apply topically 2 (two) times daily., Disp: 45 g, Rfl: 3   norethindrone (MICRONOR) 0.35 MG tablet, Take 1 tablet (0.35 mg total) by mouth daily., Disp: 84 tablet, Rfl: 3   triamcinolone cream (KENALOG) 0.1 %, Apply 1 Application topically 2 (two) times daily. To affected area(s) as needed, Disp: 30 g, Rfl: 2   No Known Allergies  Past Medical History:  Diagnosis Date   Allergy    Muscle strain of left upper arm 06/13/2021     Past Surgical History:  Procedure Laterality Date   CESAREAN SECTION      Family History  Problem Relation Age of Onset   Diabetes Mother    Healthy  Father    Autoimmune disease Sister    Thyroid disease Sister     Social History   Tobacco Use   Smoking status: Never   Smokeless tobacco: Never  Vaping Use   Vaping Use: Never used  Substance Use Topics   Alcohol use: Yes    Comment: occasionally   Drug use: No    ROS   Objective:   Vitals: BP 110/73 (BP Location: Right Arm)   Pulse 98   Temp 99 F (37.2 C) (Oral)   Resp 16   LMP 08/18/2022 (Approximate)   SpO2 96%   Physical Exam Constitutional:      General: She is not in acute distress.    Appearance: Normal appearance. She is well-developed and normal weight. She is ill-appearing. She is not toxic-appearing or diaphoretic.  HENT:     Head: Normocephalic and atraumatic.     Right Ear: Tympanic membrane, ear canal and external ear normal. No drainage or tenderness. No middle ear effusion. There is no impacted cerumen. Tympanic membrane is not erythematous or bulging.     Left Ear: Tympanic membrane, ear canal and external ear normal. No drainage or tenderness.  No middle ear effusion. There is no impacted cerumen. Tympanic membrane is not erythematous or bulging.     Nose: Congestion present. No rhinorrhea.     Mouth/Throat:     Mouth: Mucous membranes are moist. No oral lesions.     Pharynx: No pharyngeal swelling, oropharyngeal exudate, posterior  oropharyngeal erythema or uvula swelling.     Tonsils: No tonsillar exudate or tonsillar abscesses.  Eyes:     General: No scleral icterus.       Right eye: No discharge.        Left eye: No discharge.     Extraocular Movements: Extraocular movements intact.     Right eye: Normal extraocular motion.     Left eye: Normal extraocular motion.     Conjunctiva/sclera: Conjunctivae normal.  Cardiovascular:     Rate and Rhythm: Normal rate and regular rhythm.     Heart sounds: Normal heart sounds. No murmur heard.    No friction rub. No gallop.  Pulmonary:     Effort: Pulmonary effort is normal. No respiratory  distress.     Breath sounds: No stridor. No wheezing, rhonchi or rales.  Chest:     Chest wall: No tenderness.  Musculoskeletal:     Cervical back: Normal range of motion and neck supple.  Lymphadenopathy:     Cervical: No cervical adenopathy.  Skin:    General: Skin is warm and dry.  Neurological:     General: No focal deficit present.     Mental Status: She is alert and oriented to person, place, and time.  Psychiatric:        Mood and Affect: Mood normal.        Behavior: Behavior normal.     Assessment and Plan :   PDMP not reviewed this encounter.  1. Acute non-recurrent sinusitis of other sinus   2. Allergic rhinitis   3. Allergic rhinitis, unspecified seasonality, unspecified trigger     Will start empiric treatment for sinusitis with amoxicillin.  Recommended supportive care otherwise including the use of oral antihistamine, decongestant. Deferred imaging given clear cardiopulmonary exam, hemodynamically stable vital signs.  Given timeline of illness deferred COVID testing.  Counseled patient on potential for adverse effects with medications prescribed/recommended today, ER and return-to-clinic precautions discussed, patient verbalized understanding.    Wallis Bamberg, PA-C 08/22/22 1046

## 2022-08-22 NOTE — ED Triage Notes (Signed)
C/O productive cough, congestion, sore throat, body aches and intermittent tactile fevers over past week. Reports negative home Covid test yesterday.  Has been taking Mucinex, Robitussin DM, Alka Seltzer.

## 2022-12-04 ENCOUNTER — Telehealth (HOSPITAL_BASED_OUTPATIENT_CLINIC_OR_DEPARTMENT_OTHER): Payer: Self-pay | Admitting: Obstetrics & Gynecology

## 2022-12-04 NOTE — Telephone Encounter (Signed)
Called patient and left a message to call the office back to schedule the appointment .  

## 2022-12-13 ENCOUNTER — Ambulatory Visit
Admission: RE | Admit: 2022-12-13 | Discharge: 2022-12-13 | Disposition: A | Discharge status: Home / Self Care | Payer: BC Managed Care – PPO | Source: Ambulatory Visit | Attending: Family Medicine | Admitting: Family Medicine

## 2022-12-13 VITALS — BP 118/76 | HR 80 | Temp 98.0°F | Resp 18 | Ht 63.0 in | Wt 200.0 lb

## 2022-12-13 DIAGNOSIS — J020 Streptococcal pharyngitis: Secondary | ICD-10-CM

## 2022-12-13 LAB — POCT RAPID STREP A (OFFICE): Rapid Strep A Screen: POSITIVE — AB

## 2022-12-13 MED ORDER — AMOXICILLIN 875 MG PO TABS: 875.0000 mg | ORAL_TABLET | Freq: Two times a day (BID) | ORAL | 0 refills | Status: DC

## 2022-12-13 MED ORDER — KETOROLAC TROMETHAMINE 30 MG/ML IJ SOLN
30.0000 mg | Freq: Once | INTRAMUSCULAR | Status: AC
  Administered 2022-12-13: 30 mg via INTRAMUSCULAR

## 2022-12-13 NOTE — ED Provider Notes (Signed)
[facility]    CSN: [id number] Arrival date & time: 12/13/22  0859      History   Chief Complaint Chief Complaint  Patient presents with   Sore Throat    I think I may have a sinus infection. - Entered by patient    HPI Tanya Mccoy is a 47 y.o. female.   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  She currently has sore throat with mild cough and congestion, bilateral ear pain, and headache for a week.  Does not know if she had a fever.  Her home COVID test is negative.  She thought she had a migraine and took her triptan.  The headache persists.  Past Medical History:  Diagnosis Date   Allergy    Muscle strain of left upper arm 06/13/2021    Patient Active Problem List   Diagnosis Date Noted   Irregular menses 04/18/2022   Dysmenorrhea 04/18/2022   Rash due to allergy 04/18/2022   Sinusitis 12/10/2021   Pain and numbness of upper extremity 10/22/2021   Pain in right leg 10/22/2021   Weight gain 06/13/2021   Fatigue 06/13/2021   Encounter for medical examination to establish care 06/13/2021   Seasonal allergies 11/21/2019    Past Surgical History:  Procedure Laterality Date   CESAREAN SECTION      OB History   No obstetric history on file.      Home Medications    Prior to Admission medications   Medication Sig Start Date End Date Taking? Authorizing Provider  Multiple Vitamin (MULTIVITAMIN WITH MINERALS) TABS tablet Take 1 tablet by mouth daily.   Yes [provider]  pseudoephedrine (SUDAFED) 60 MG tablet Take 1 tablet (60 mg total) by mouth every 8 (eight) hours as needed for congestion. 08/22/22  Yes Jaynee Eagles, PA-C  amoxicillin (AMOXIL) 875 MG tablet Take 1 tablet (875 mg total) by mouth 2 (two) times daily. 12/13/22   Raylene Everts, MD    Family History Family History  Problem Relation Age of Onset   Diabetes Mother    Healthy Father    Autoimmune disease Sister    Thyroid  disease Sister     Social History Social History   Tobacco Use   Smoking status: Never   Smokeless tobacco: Never  Vaping Use   Vaping Use: Never used  Substance Use Topics   Alcohol use: Yes    Comment: occasionally   Drug use: No     Allergies   Patient has no known allergies.   Review of Systems Review of Systems See HPI  Physical Exam Triage Vital Signs ED Triage Vitals  Enc Vitals Group     BP 12/13/22 0905 118/76     Pulse Rate 12/13/22 0905 80     Resp 12/13/22 0905 18     Temp 12/13/22 0905 98 F (36.7 C)     Temp Source 12/13/22 0905 Oral     SpO2 12/13/22 0905 97 %     Weight 12/13/22 0907 200 lb (90.7 kg)     Height 12/13/22 0907 5' 3"$  (1.6 m)     Head Circumference --      Peak Flow --      Pain Score 12/13/22 0907 7     Pain Loc --      Pain Edu? --      Excl. in [place]? --    No data found.  Updated Vital  Signs BP 118/76 (BP Location: Right Arm)   Pulse 80   Temp 98 F (36.7 C) (Oral)   Resp 18   Ht 5' 3"$  (1.6 m)   Wt 90.7 kg   LMP 11/29/2022   SpO2 97%   BMI 35.43 kg/m       Physical Exam Constitutional:      General: She is not in acute distress.    Appearance: She is well-developed. She is ill-appearing.  HENT:     Head: Normocephalic and atraumatic.     Right Ear: Tympanic membrane and ear canal normal. Tympanic membrane is not erythematous.     Left Ear: Tympanic membrane and ear canal normal. Tympanic membrane is not erythematous.     Nose: No congestion.     Mouth/Throat:     Pharynx: Pharyngeal swelling and posterior oropharyngeal erythema present.     Tonsils: No tonsillar exudate. 1+ on the right. 1+ on the left.  Eyes:     Conjunctiva/sclera: Conjunctivae normal.     Pupils: Pupils are equal, round, and reactive to light.  Cardiovascular:     Rate and Rhythm: Normal rate and regular rhythm.  Pulmonary:     Effort: Pulmonary effort is normal. No respiratory distress.     Breath sounds: Normal breath sounds.   Abdominal:     General: There is no distension.     Palpations: Abdomen is soft.  Musculoskeletal:        General: Normal range of motion.     Cervical back: Normal range of motion.  Lymphadenopathy:     Cervical: Cervical adenopathy present.  Skin:    General: Skin is warm and dry.  Neurological:     Mental Status: She is alert.      UC Treatments / Results  Labs (all labs ordered are listed, but only abnormal results are displayed) Labs Reviewed  POCT RAPID STREP A (OFFICE) - Abnormal; Notable for the following components:      Result Value   Rapid Strep A Screen Positive (*)    All other components within normal limits    EKG   Radiology No results found.  Procedures Procedures (including critical care time)  Medications Ordered in UC Medications  ketorolac (TORADOL) 30 MG/ML injection 30 mg (30 mg Intramuscular Given 12/13/22 0924)    Initial Impression / Assessment and Plan / UC Course  I have reviewed the triage vital signs and the nursing notes.  Pertinent labs & imaging results that were available during my care of the patient were reviewed by me and considered in my medical decision making (see chart for details).     Reviewed strep throat.  Contagiousness.  Need for 10 days antibiotics. Final Clinical Impressions(s) / UC Diagnoses   Final diagnoses:  Strep throat     Discharge Instructions      It is important that you take the amoxicillin 2 times a day for 10 full days.  Take 2 doses today May use salt water gargles, Chloraseptic spray or lozenges for pain Take ibuprofen or Tylenol as needed for throat pain and headache      ED Prescriptions     Medication Sig Dispense Auth. Provider   amoxicillin (AMOXIL) 875 MG tablet Take 1 tablet (875 mg total) by mouth 2 (two) times daily. 20 tablet Raylene Everts, MD      PDMP not reviewed this encounter.   Raylene Everts, MD 12/13/22 [phone]

## 2022-12-13 NOTE — Discharge Instructions (Signed)
It is important that you take the amoxicillin 2 times a day for 10 full days.  Take 2 doses today May use salt water gargles, Chloraseptic spray or lozenges for pain Take ibuprofen or Tylenol as needed for throat pain and headache

## 2022-12-13 NOTE — ED Triage Notes (Signed)
Patient c/o sore throat, cough, congestion, bilateral ear pain, migraine x 1 week.  Home COVID test was negative.  Patient has taken Sudafed, Dayquil and Nyquil.

## 2022-12-14 ENCOUNTER — Encounter: Payer: Self-pay | Admitting: *Deleted

## 2022-12-14 ENCOUNTER — Telehealth: Payer: Self-pay | Admitting: Emergency Medicine

## 2022-12-14 NOTE — Telephone Encounter (Signed)
Tanya Mccoy.  Advised patient that if she's doing well, no need to return call.  Any questions or concerns to contact the office.

## 2022-12-17 ENCOUNTER — Ambulatory Visit (INDEPENDENT_AMBULATORY_CARE_PROVIDER_SITE_OTHER): Payer: BC Managed Care – PPO | Admitting: Obstetrics and Gynecology

## 2022-12-17 ENCOUNTER — Other Ambulatory Visit (HOSPITAL_COMMUNITY)
Admission: RE | Admit: 2022-12-17 | Discharge: 2022-12-17 | Disposition: A | Discharge status: Home / Self Care | Payer: BC Managed Care – PPO | Source: Ambulatory Visit | Attending: Obstetrics and Gynecology | Admitting: Obstetrics and Gynecology

## 2022-12-17 ENCOUNTER — Encounter: Payer: Self-pay | Admitting: Obstetrics and Gynecology

## 2022-12-17 VITALS — BP 129/69 | HR 96 | Ht 63.0 in | Wt 201.0 lb

## 2022-12-17 DIAGNOSIS — Z01419 Encounter for gynecological examination (general) (routine) without abnormal findings: Secondary | ICD-10-CM | POA: Insufficient documentation

## 2022-12-17 DIAGNOSIS — Z124 Encounter for screening for malignant neoplasm of cervix: Secondary | ICD-10-CM

## 2022-12-17 DIAGNOSIS — Z23 Encounter for immunization: Secondary | ICD-10-CM

## 2022-12-17 NOTE — Progress Notes (Signed)
ANNUAL EXAM Patient name: Tanya Mccoy MRN [id number]  Date of birth: 10-06-1976 Chief Complaint:   No chief complaint on file.  History of Present Illness:   Tanya Mccoy is a 47 y.o. [id number] with Patient's last menstrual period was 11/29/2022. being seen today for a routine annual exam.  Current complaints: Painful periods. Was on mini pill and cycles then also became irregular and pain did not improve.  Having a lot of life stressors. 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 No    Does the patient's partner want to become pregnant in the next year? N/A    Would the patient like to discuss contraceptive options today? N/A      Contraception Wrap Up   Current Method No Contraceptive Precautions;Abstinence    End Method No Contraception Precautions;Abstinence    Contraception Counseling Provided Yes    How was the end contraceptive method provided? N/A            The pregnancy intention screening data noted above was reviewed. Potential methods of contraception were discussed. The patient elected to proceed with No Contraception Precautions; Abstinence.   Pap Hx 01/28/10: NILM 12/04/16 NILM/HPV neg Last mammogram: 03/27/22. Results were: normal. Last colonoscopy: Cologuard 06/23/21 . Results were: normal.      04/06/2022    9:35 AM 06/13/2021    2:26 PM 05/02/2021    9:14 AM 02/11/2021    9:49 AM 12/26/2020    3:30 PM  Depression screen PHQ 2/9  Decreased Interest 0 0 0 0 0  Down, Depressed, Hopeless 0 0 0 0 0  PHQ - 2 Score 0 0 0 0 0  Altered sleeping  0 0    Tired, decreased energy  0 0    Change in appetite  2 1    Feeling bad or failure about yourself   0 0    Trouble concentrating  0 0    Moving slowly or fidgety/restless  0 0    Suicidal thoughts  0 0    PHQ-9 Score  2 1    Difficult doing work/chores  Not difficult at all Not  difficult at all        06/13/2021    2:27 PM 05/02/2021    9:14 AM  GAD 7 : Generalized Anxiety Score  Nervous, Anxious, on Edge 0 0  Control/stop worrying 0 0  Worry too much - different things 0 0  Trouble relaxing 0 0  Restless 0 0  Easily annoyed or irritable 0 0  Afraid - awful might happen 0 0  Total GAD 7 Score 0 0  Anxiety Difficulty  Not difficult at all   Review of Systems:   Pertinent items are noted in HPI Denies any headaches, blurred vision, fatigue, shortness of breath, chest pain, abdominal pain, abnormal vaginal discharge/itching/odor/irritation, bowel movements, urination, or intercourse unless otherwise stated above. Pertinent History Reviewed:  Reviewed past medical,surgical, social and family history.  Reviewed problem list, medications and allergies. Physical Assessment:   Vitals:   12/17/22 1403  BP: 129/69  Pulse: 96  Weight: 201 lb (91.2 kg)  Height: 5' 3"$  (1.6 m)  Body mass index is 35.61 kg/m.        Physical Examination:   General appearance - well appearing, and in no distress  Mental status - alert, oriented to  person, place, and time  Chest - respiratory effort normal  Heart - normal peripheral perfusion  Breasts - breasts appear normal, no suspicious masses, no skin or nipple changes or axillary nodes  Abdomen - soft, nontender, nondistended, no masses or organomegaly  Pelvic - VULVA: normal appearing vulva with no masses, tenderness or lesions  VAGINA: normal appearing vagina with normal color and discharge, no lesions  CERVIX: normal appearing cervix without discharge or lesions, no CMT  Thin prep pap is done with HR HPV cotesting  UTERUS: uterus is felt to be normal size, shape, consistency and nontender   ADNEXA: No adnexal masses or tenderness noted.  Chaperone present for exam  No results found for this or any previous visit (from the past 24 hour(s)).  Assessment & Plan:  1) Well-Woman Exam Mammogram: due 03/2023 Colonoscopy:  doing Cologuard. Planned for repeat 2025 Pap: collected  2) Dysmenorrhea Reviewed that irregular bleeding is common on mini pill and likely the source of the new irregularity in her periods Discussed scheduled NSAIDs as well as option for Depo, LNG-IUD and implant Will trial scheduled NSAIDs  Follow-up: Return in about 1 year (around 12/18/2023) for annual exam.  [name], MD 12/17/2022 4:34 PM

## 2022-12-17 NOTE — Patient Instructions (Addendum)
Ibuprofen 679m (3 pills) every 6-8 hours for the 2 days before your period and throughout the rest of your period  Your pap results will be in MyChart in 1-2 weeks. If everything is normal, you won't need another pap for 5 years

## 2022-12-18 LAB — CYTOLOGY - PAP
Adequacy: ABSENT
Comment: NEGATIVE
Diagnosis: NEGATIVE
High risk HPV: NEGATIVE

## 2023-03-29 IMAGING — MG MM DIGITAL SCREENING BILAT W/ TOMO AND CAD
8 series · 8 of 24 positions shown · non-contrast
Comparison: Previous exam(s).

CLINICAL DATA: Screening.

EXAM:
DIGITAL SCREENING BILATERAL MAMMOGRAM WITH TOMOSYNTHESIS AND CAD
TECHNIQUE: Bilateral screening digital craniocaudal and mediolateral oblique
mammograms were obtained. Bilateral screening digital breast
tomosynthesis was performed. The images were evaluated with
computer-aided detection.

[R MLO synth-2D]
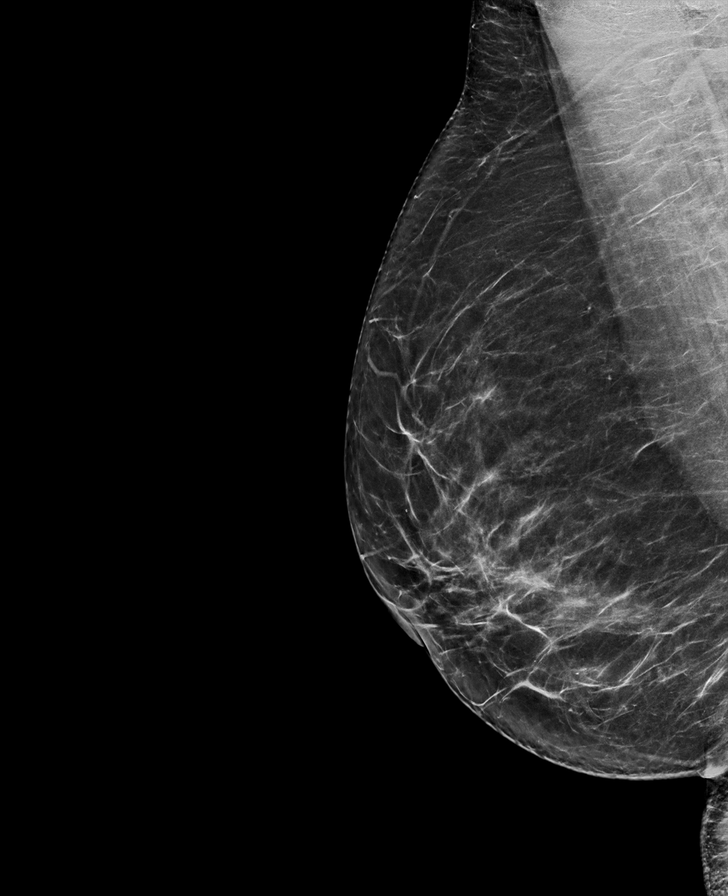

[R CC synth-2D]
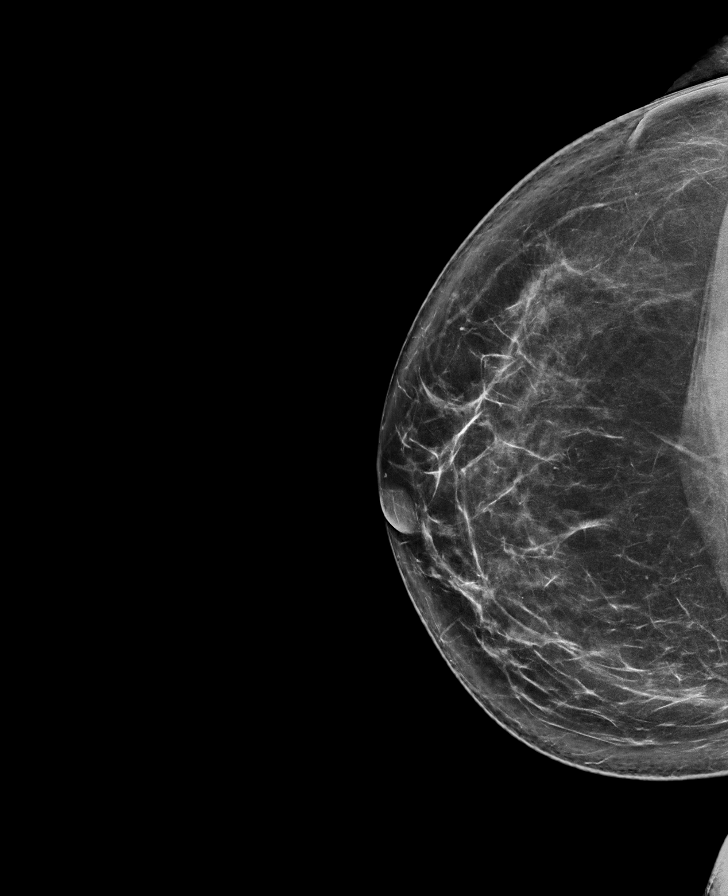

[L MLO synth-2D]
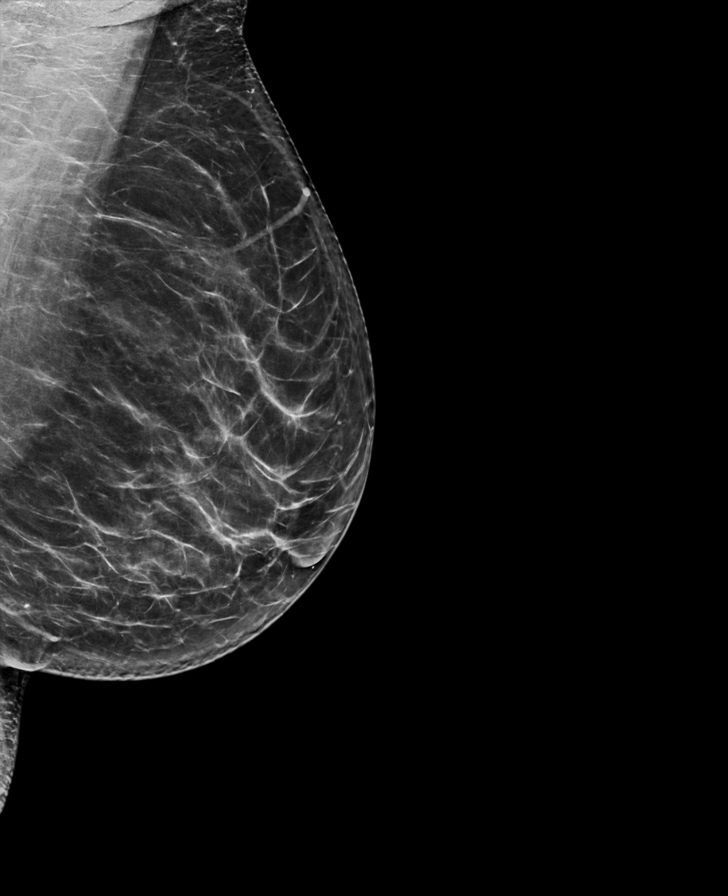

[L CC synth-2D]
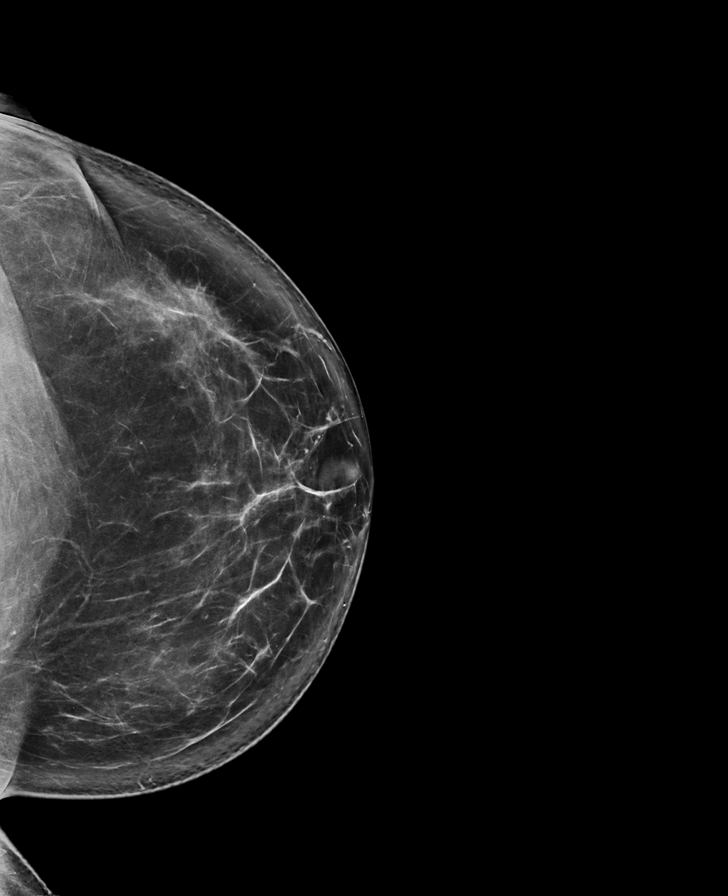

[R MLO tomo · tomo slice 41/82.0]
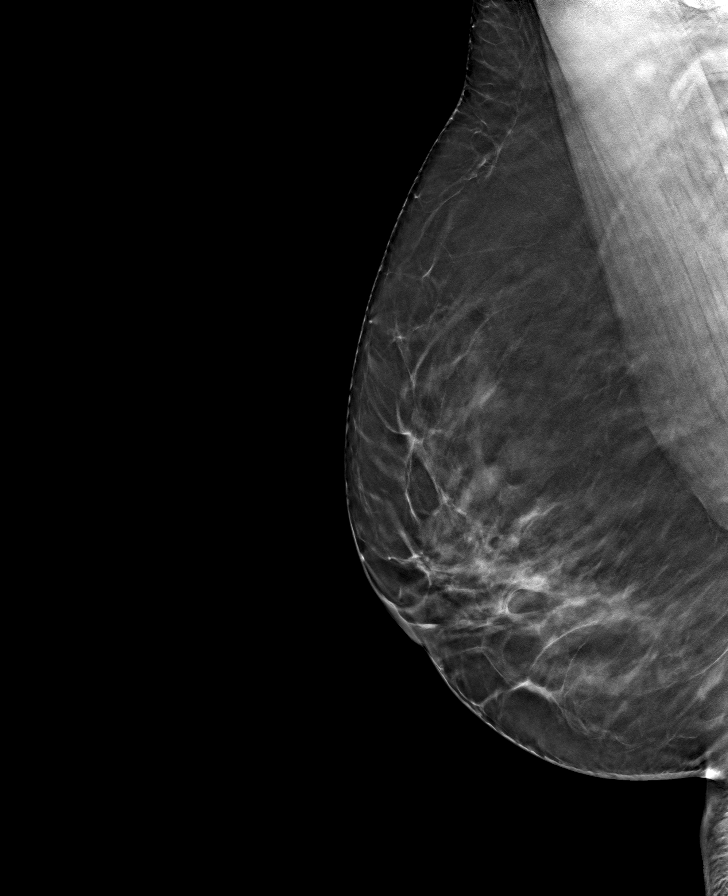

[L MLO tomo · tomo slice 43/84.0]
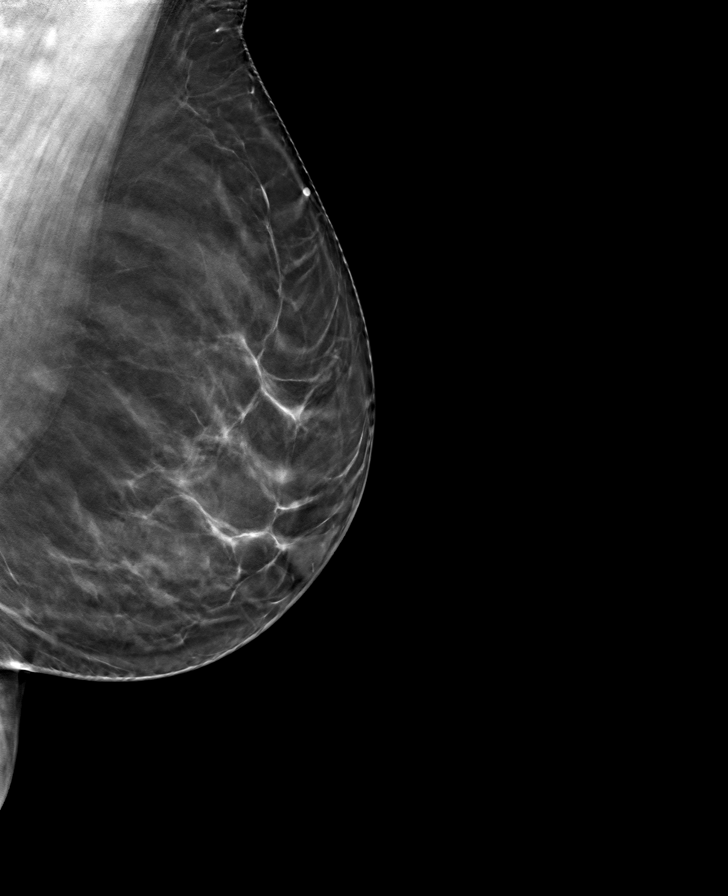

[L CC tomo · tomo slice 47/94.0]
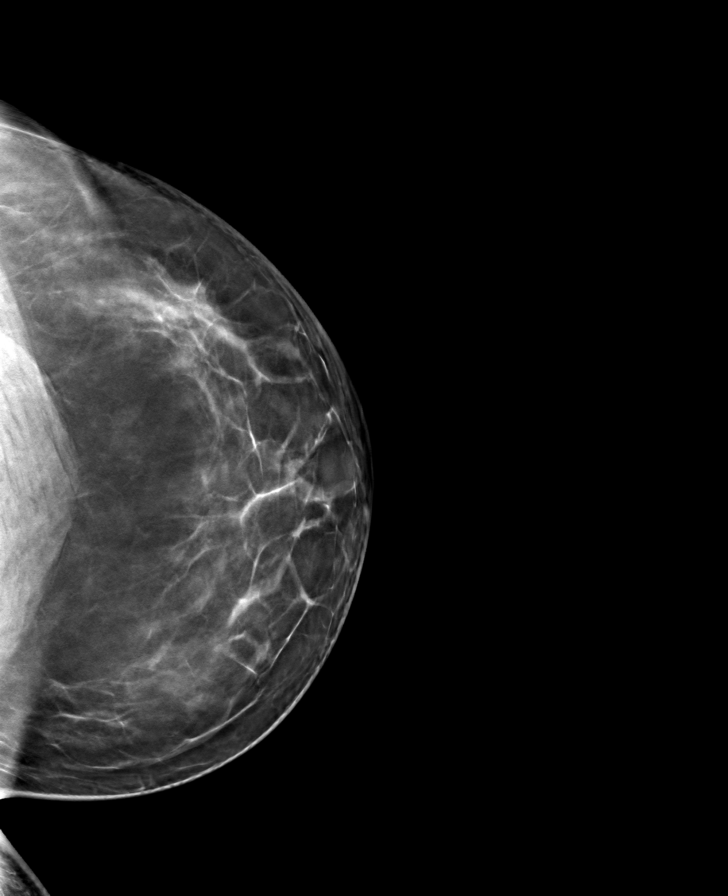

[R CC tomo · tomo slice 45/88.0]
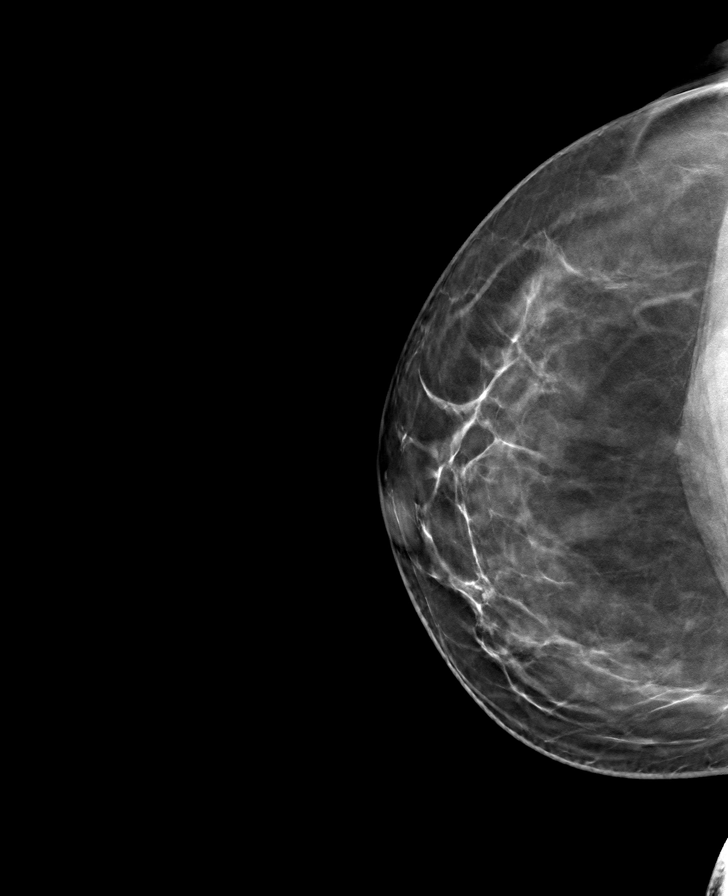

[8 of 24 positions shown; findings below may reference images not displayed]

ACR Breast Density Category b: There are scattered areas of
fibroglandular density.
FINDINGS: There are no findings suspicious for malignancy.
IMPRESSION: No mammographic evidence of malignancy. A result letter of this
screening mammogram will be mailed directly to the patient.

RECOMMENDATION:
Screening mammogram in one year. (Code:51-O-LD2)

BI-RADS CATEGORY  1: Negative.

## 2023-04-05 ENCOUNTER — Ambulatory Visit: Payer: Medicaid Other | Admitting: Nurse Practitioner

## 2023-04-07 ENCOUNTER — Encounter: Payer: Self-pay | Admitting: Nurse Practitioner

## 2023-04-07 ENCOUNTER — Ambulatory Visit: Payer: Medicaid Other | Admitting: Nurse Practitioner

## 2023-04-07 VITALS — BP 102/68 | HR 95 | Temp 98.1°F | Ht 63.0 in | Wt 193.2 lb

## 2023-04-07 DIAGNOSIS — Z0001 Encounter for general adult medical examination with abnormal findings: Secondary | ICD-10-CM | POA: Diagnosis not present

## 2023-04-07 DIAGNOSIS — Z833 Family history of diabetes mellitus: Secondary | ICD-10-CM | POA: Diagnosis not present

## 2023-04-07 DIAGNOSIS — J302 Other seasonal allergic rhinitis: Secondary | ICD-10-CM | POA: Diagnosis not present

## 2023-04-07 DIAGNOSIS — Z7689 Persons encountering health services in other specified circumstances: Secondary | ICD-10-CM

## 2023-04-07 DIAGNOSIS — Z0000 Encounter for general adult medical examination without abnormal findings: Secondary | ICD-10-CM | POA: Insufficient documentation

## 2023-04-07 LAB — BAYER DCA HB A1C WAIVED: HB A1C (BAYER DCA - WAIVED): 5 % (ref 4.8–5.6)

## 2023-04-07 NOTE — Progress Notes (Signed)
New Patient Office Visit  Subjective    Patient ID: ADEJA BERINGER, female    DOB: 04-25-1976  Age: 47 y.o. MRN: [id number]  CC:  Chief Complaint  Patient presents with   Establish Care    HPI Tanya Mccoy is 47 year old female here today to establish care with physical.  She reports being in good health and doesn't take any medications. She is concerns about weight " I am very active, eating cleaner, but the weight is not coming off fast enough". She is not interested in weight loss medications " I don't want any medications, just want to do it naturally and keep it off". Concerns for hot flashes, had  FSH and LF level from [facility] and they were within normal range.   Hx of allergic rhinitis and taking OTC zyrtec Denies the use of tobacco, or illicit drugs  ETOH socially  Family history of diabetes mother  Denies family hx of cardiac, HTN Denies family hx of ovarian, breast or cervical cancer Last Pap Feb 2024 Last mammogram 03/31/2022 (want to wait for the mobil unit to come to [facility] Cologuard sep 2023 was normal repaet in 3 yrs Denies the use of tabacco  LMP 6/22024, still regular  Will order labs today  Outpatient Encounter Medications as of 04/07/2023  Medication Sig   Multiple Vitamin (MULTIVITAMIN WITH MINERALS) TABS tablet Take 1 tablet by mouth daily.   [DISCONTINUED] amoxicillin (AMOXIL) 875 MG tablet Take 1 tablet (875 mg total) by mouth 2 (two) times daily. (Patient not taking: Reported on 04/07/2023)   [DISCONTINUED] pseudoephedrine (SUDAFED) 60 MG tablet Take 1 tablet (60 mg total) by mouth every 8 (eight) hours as needed for congestion. (Patient not taking: Reported on 04/07/2023)   No facility-administered encounter medications on file as of 04/07/2023.    Past Medical History:  Diagnosis Date   Allergy    Migraine with aura    Muscle strain of left upper arm 06/13/2021    Past Surgical History:  Procedure Laterality Date    CESAREAN SECTION      Family History  Problem Relation Age of Onset   Diabetes Mother    Healthy Father    Autoimmune disease Sister    Thyroid disease Sister     Social History   Socioeconomic History   Marital status: Widowed    Spouse name: Not on file   Number of children: Not on file   Years of education: Not on file   Highest education level: Not on file  Occupational History   Occupation: Runner, broadcasting/film/video    Comment: [employer]  Tobacco Use   Smoking status: Never   Smokeless tobacco: Never  Vaping Use   Vaping Use: Never used  Substance and Sexual Activity   Alcohol use: Yes    Comment: occasionally   Drug use: No   Sexual activity: Not Currently    Birth control/protection: None  Other Topics Concern   Not on file  Social History Narrative   Not on file   Social Determinants of Health   Financial Resource Strain: Not on file  Food Insecurity: Not on file  Transportation Needs: Not on file  Physical Activity: Not on file  Stress: Not on file  Social Connections: Not on file  Intimate Partner Violence: Not on file    ROS Negative unless indicated in HPI   Objective    BP 102/68   Pulse 95   Temp 98.1 F (36.7 C) (Temporal)  Ht 5\' 3"  (1.6 m)   Wt 193 lb 3.2 oz (87.6 kg)   SpO2 95%   BMI 34.22 kg/m   Physical Exam Vitals and nursing note reviewed.  Constitutional:      Appearance: Normal appearance. She is overweight.  HENT:     Head: Normocephalic and atraumatic.  Eyes:     General: No scleral icterus.    Extraocular Movements: Extraocular movements intact.     Conjunctiva/sclera: Conjunctivae normal.     Pupils: Pupils are equal, round, and reactive to light.  Neck:     Vascular: No carotid bruit.  Cardiovascular:     Rate and Rhythm: Normal rate.     Heart sounds: Normal heart sounds.  Pulmonary:     Effort: Pulmonary effort is normal.     Breath sounds: Normal breath sounds.  Abdominal:     General: Bowel sounds are  normal.     Palpations: Abdomen is soft. There is no mass.     Tenderness: There is no abdominal tenderness.  Musculoskeletal:        General: Normal range of motion.     Cervical back: Normal range of motion and neck supple.     Right lower leg: No edema.     Left lower leg: No edema.  Lymphadenopathy:     Cervical: No cervical adenopathy.  Skin:    General: Skin is warm and dry.     Findings: No rash.  Neurological:     General: No focal deficit present.     Mental Status: She is alert and oriented to person, place, and time. Mental status is at baseline.  Psychiatric:        Mood and Affect: Mood normal.        Behavior: Behavior normal.        Thought Content: Thought content normal.        Judgment: Judgment normal.     Last CBC Lab Results  Component Value Date   WBC 6.1 10/22/2021   HGB 13.1 10/22/2021   HCT 41.3 10/22/2021   MCV 89 10/22/2021   MCH 28.1 10/22/2021   RDW 12.6 10/22/2021   PLT 292 10/22/2021   Last metabolic panel Lab Results  Component Value Date   GLUCOSE 100 (H) 10/22/2021   NA 141 10/22/2021   K 4.7 10/22/2021   CL 105 10/22/2021   CO2 25 10/22/2021   BUN 13 10/22/2021   CREATININE 0.78 10/22/2021   EGFR 95 10/22/2021   CALCIUM 9.4 10/22/2021   PROT 6.8 10/22/2021   ALBUMIN 4.4 10/22/2021   LABGLOB 2.4 10/22/2021   AGRATIO 1.8 10/22/2021   BILITOT <0.2 10/22/2021   ALKPHOS 73 10/22/2021   AST 14 10/22/2021   ALT 16 10/22/2021   Last lipids Lab Results  Component Value Date   CHOL 187 06/13/2021   HDL 52 06/13/2021   LDLCALC 109 (H) 06/13/2021   TRIG 148 06/13/2021   CHOLHDL 3.6 06/13/2021   Last hemoglobin A1c Lab Results  Component Value Date   HGBA1C 5.3 10/22/2021   Last thyroid functions Lab Results  Component Value Date   TSH 1.460 06/13/2021   T4TOTAL 5.5 11/21/2019        Assessment & Plan:  Encounter to establish care -     CBC with Differential/Platelet -     CMP14+EGFR -     Lipid panel -      Thyroid Panel With TSH -     Bayer DCA Hb A1c  Waived  General medical exam  Seasonal allergic rhinitis, unspecified trigger  Family history of diabetes mellitus in mother  Assess  Well 59 yrs old woman Plan Seasonal allergic rhinitis, unspecified trigger Continue OTC zyrtec for allergic rhinitis  Take Balck Cohosh to help to perimenopause symptoms  Labs: CBC, CMP, THS, Lipid order  She is due for mammogram and wants to wait for the  mobile bus to come to [place]  Return in about 1 year (around 04/06/2024) for physical.   Arrie Aran Santa Lighter DNP

## 2023-04-08 LAB — CBC WITH DIFFERENTIAL/PLATELET
Basophils Absolute: 0 10*3/uL (ref 0.0–0.2)
Basos: 1 %
EOS (ABSOLUTE): 0.3 10*3/uL (ref 0.0–0.4)
Eos: 7 %
Hematocrit: 40.4 % (ref 34.0–46.6)
Hemoglobin: 13 g/dL (ref 11.1–15.9)
Immature Grans (Abs): 0 10*3/uL (ref 0.0–0.1)
Immature Granulocytes: 0 %
Lymphocytes Absolute: 1.2 10*3/uL (ref 0.7–3.1)
Lymphs: 29 %
MCH: 28.4 pg (ref 26.6–33.0)
MCHC: 32.2 g/dL (ref 31.5–35.7)
MCV: 88 fL (ref 79–97)
Monocytes Absolute: 0.6 10*3/uL (ref 0.1–0.9)
Monocytes: 15 %
Neutrophils Absolute: 2 10*3/uL (ref 1.4–7.0)
Neutrophils: 48 %
Platelets: 284 10*3/uL (ref 150–450)
RBC: 4.57 x10E6/uL (ref 3.77–5.28)
RDW: 13.2 % (ref 11.7–15.4)
WBC: 4.1 10*3/uL (ref 3.4–10.8)

## 2023-04-08 LAB — THYROID PANEL WITH TSH
Free Thyroxine Index: 1.9 (ref 1.2–4.9)
T3 Uptake Ratio: 26 % (ref 24–39)
T4, Total: 7.4 ug/dL (ref 4.5–12.0)
TSH: 1.17 u[IU]/mL (ref 0.450–4.500)

## 2023-04-08 LAB — LIPID PANEL
Chol/HDL Ratio: 3.4 ratio (ref 0.0–4.4)
Cholesterol, Total: 192 mg/dL (ref 100–199)
HDL: 56 mg/dL (ref >39)
LDL Chol Calc (NIH): 124 mg/dL — ABNORMAL HIGH (ref 0–99)
Triglycerides: 64 mg/dL (ref 0–149)
VLDL Cholesterol Cal: 12 mg/dL (ref 5–40)

## 2023-04-08 LAB — CMP14+EGFR
ALT: 23 IU/L (ref 0–32)
AST: 14 IU/L (ref 0–40)
Albumin/Globulin Ratio: 1.7
Albumin: 4.3 g/dL (ref 3.9–4.9)
Alkaline Phosphatase: 80 IU/L (ref 44–121)
BUN/Creatinine Ratio: 16 (ref 9–23)
BUN: 11 mg/dL (ref 6–24)
Bilirubin Total: <0.2 mg/dL (ref 0.0–1.2)
CO2: 22 mmol/L (ref 20–29)
Calcium: 9.4 mg/dL (ref 8.7–10.2)
Chloride: 104 mmol/L (ref 96–106)
Creatinine, Ser: 0.69 mg/dL (ref 0.57–1.00)
Globulin, Total: 2.5 g/dL (ref 1.5–4.5)
Glucose: 85 mg/dL (ref 70–99)
Potassium: 4.2 mmol/L (ref 3.5–5.2)
Sodium: 139 mmol/L (ref 134–144)
Total Protein: 6.8 g/dL (ref 6.0–8.5)
eGFR: 108 mL/min/{1.73_m2} (ref >59)

## 2023-04-09 ENCOUNTER — Other Ambulatory Visit: Payer: Self-pay | Admitting: Nurse Practitioner

## 2023-04-09 DIAGNOSIS — Z1231 Encounter for screening mammogram for malignant neoplasm of breast: Secondary | ICD-10-CM

## 2023-04-21 ENCOUNTER — Ambulatory Visit
Admission: RE | Admit: 2023-04-21 | Discharge: 2023-04-21 | Disposition: A | Discharge status: Home / Self Care | Payer: Medicaid Other | Source: Ambulatory Visit | Attending: Nurse Practitioner | Admitting: Nurse Practitioner

## 2023-04-21 DIAGNOSIS — Z1231 Encounter for screening mammogram for malignant neoplasm of breast: Secondary | ICD-10-CM | POA: Diagnosis not present

## 2023-05-22 DIAGNOSIS — T63461A Toxic effect of venom of wasps, accidental (unintentional), initial encounter: Secondary | ICD-10-CM | POA: Diagnosis not present

## 2023-07-13 ENCOUNTER — Encounter: Payer: Self-pay | Admitting: Emergency Medicine

## 2023-07-13 ENCOUNTER — Ambulatory Visit
Admission: EM | Admit: 2023-07-13 | Discharge: 2023-07-13 | Disposition: A | Discharge status: Home / Self Care | Payer: Medicaid Other | Attending: Family Medicine | Admitting: Family Medicine

## 2023-07-13 ENCOUNTER — Other Ambulatory Visit: Payer: Self-pay

## 2023-07-13 DIAGNOSIS — J3489 Other specified disorders of nose and nasal sinuses: Secondary | ICD-10-CM

## 2023-07-13 DIAGNOSIS — J0100 Acute maxillary sinusitis, unspecified: Secondary | ICD-10-CM

## 2023-07-13 DIAGNOSIS — R059 Cough, unspecified: Secondary | ICD-10-CM | POA: Diagnosis not present

## 2023-07-13 MED ORDER — PREDNISONE 20 MG PO TABS: ORAL_TABLET | ORAL | 0 refills | Status: DC

## 2023-07-13 MED ORDER — BENZONATATE 200 MG PO CAPS: 200.0000 mg | ORAL_CAPSULE | Freq: Three times a day (TID) | ORAL | 0 refills | Status: AC | PRN

## 2023-07-13 MED ORDER — AZITHROMYCIN 250 MG PO TABS: 250.0000 mg | ORAL_TABLET | Freq: Every day | ORAL | 0 refills | Status: DC

## 2023-07-13 NOTE — Discharge Instructions (Addendum)
Instructed patient to take medications as directed with food to completion.  Advised may take Tessalon capsules daily or as needed for cough.  Encouraged to increase daily water intake to 64 ounces per day while taking these medications.  Advised if symptoms worsen and/or unresolved please follow-up PCP or here for further evaluation.

## 2023-07-13 NOTE — ED Triage Notes (Signed)
Cough, sore throat and ear soreness, slight congestion and headache.  Onset 2 days ago.  Patient has taken mucinex and acetaminophen or ibuprofen, and cough drops

## 2023-07-13 NOTE — ED Provider Notes (Signed)
[facility]    CSN: [id number] Arrival date & time: 07/13/23  0814      History   Chief Complaint Chief Complaint  Patient presents with   Cough    HPI Tanya Mccoy is a 47 y.o. female.   HPI pleasant 47 year old female presents with cough, sore throat, ear soreness, congestion and headache for 2 to 3 days.  PMH significant for sinusitis, dysmenorrhea, and fatigue.  Past Medical History:  Diagnosis Date   Allergy    Migraine with aura    Muscle strain of left upper arm 06/13/2021    Patient Active Problem List   Diagnosis Date Noted   Encounter to establish care 04/07/2023   Family history of diabetes mellitus in mother 04/07/2023   Seasonal allergic rhinitis 04/07/2023   General medical exam 04/07/2023   Dysmenorrhea 04/18/2022   Rash due to allergy 04/18/2022   Sinusitis 12/10/2021   Pain and numbness of upper extremity 10/22/2021   Pain in right leg 10/22/2021   Weight gain 06/13/2021   Fatigue 06/13/2021   Seasonal allergies 11/21/2019    Past Surgical History:  Procedure Laterality Date   CESAREAN SECTION      OB History     Gravida  3   Para  2   Term  2   Preterm      AB  1   Living  2      SAB  1   IAB      Ectopic      Multiple      Live Births               Home Medications    Prior to Admission medications   Medication Sig Start Date End Date Taking? Authorizing Provider  azithromycin (ZITHROMAX) 250 MG tablet Take 1 tablet (250 mg total) by mouth daily. Take first 2 tablets together, then 1 every day until finished. 07/13/23  Yes Trevor Iha, FNP  benzonatate (TESSALON) 200 MG capsule Take 1 capsule (200 mg total) by mouth 3 (three) times daily as needed for up to 7 days. 07/13/23 07/20/23 Yes Trevor Iha, FNP  predniSONE (DELTASONE) 20 MG tablet Take 3 tabs PO daily x 5 days. 07/13/23  Yes Trevor Iha, FNP  Multiple Vitamin (MULTIVITAMIN WITH MINERALS) TABS tablet Take 1 tablet by mouth  daily.    [provider]    Family History Family History  Problem Relation Age of Onset   Diabetes Mother    Healthy Father    Autoimmune disease Sister    Thyroid disease Sister    Breast cancer Neg Hx     Social History Social History   Tobacco Use   Smoking status: Never   Smokeless tobacco: Never  Vaping Use   Vaping status: Never Used  Substance Use Topics   Alcohol use: Yes    Comment: occasionally   Drug use: No     Allergies   Patient has no known allergies.   Review of Systems Review of Systems  HENT:  Positive for congestion, ear pain, postnasal drip and sinus pressure.   Respiratory:  Positive for cough.   All other systems reviewed and are negative.    Physical Exam Triage Vital Signs ED Triage Vitals [07/13/23 0834]  Encounter Vitals Group     BP      Systolic BP Percentile      Diastolic BP Percentile      Pulse      Resp  Temp      Temp src      SpO2      Weight      Height      Head Circumference      Peak Flow      Pain Score 6     Pain Loc      Pain Education      Exclude from Growth Chart    No data found.  Updated Vital Signs BP 112/71 (BP Location: Left Arm)   Pulse 75   Temp 98.2 F (36.8 C) (Oral)   Resp 18   LMP 07/09/2023   SpO2 99%       Physical Exam Vitals and nursing note reviewed.  Constitutional:      Appearance: Normal appearance. She is obese. She is ill-appearing.  HENT:     Head: Normocephalic and atraumatic.     Right Ear: Tympanic membrane and external ear normal.     Left Ear: Tympanic membrane and external ear normal.     Ears:     Comments: Moderate eustachian tube dysfunction noted bilaterally    Nose:     Right Sinus: Maxillary sinus tenderness present.     Left Sinus: Maxillary sinus tenderness present.     Comments: Turbinates are erythematous/edematous    Mouth/Throat:     Mouth: Mucous membranes are moist.     Pharynx: Oropharynx is clear.  Eyes:     Extraocular  Movements: Extraocular movements intact.     Conjunctiva/sclera: Conjunctivae normal.     Pupils: Pupils are equal, round, and reactive to light.  Cardiovascular:     Rate and Rhythm: Normal rate and regular rhythm.     Pulses: Normal pulses.     Heart sounds: Normal heart sounds.  Pulmonary:     Effort: Pulmonary effort is normal.     Breath sounds: Normal breath sounds. No wheezing, rhonchi or rales.     Comments: Infrequent nonproductive cough noted on exam Musculoskeletal:        General: Normal range of motion.     Cervical back: Normal range of motion and neck supple. No tenderness.  Lymphadenopathy:     Cervical: No cervical adenopathy.  Skin:    General: Skin is warm and dry.  Neurological:     General: No focal deficit present.     Mental Status: She is alert and oriented to person, place, and time. Mental status is at baseline.  Psychiatric:        Mood and Affect: Mood normal.        Behavior: Behavior normal.        Thought Content: Thought content normal.      UC Treatments / Results  Labs (all labs ordered are listed, but only abnormal results are displayed) Labs Reviewed - No data to display  EKG   Radiology No results found.  Procedures Procedures (including critical care time)  Medications Ordered in UC Medications - No data to display  Initial Impression / Assessment and Plan / UC Course  I have reviewed the triage vital signs and the nursing notes.  Pertinent labs & imaging results that were available during my care of the patient were reviewed by me and considered in my medical decision making (see chart for details).     MDM: 1.  Subacute maxillary sinusitis-Rx'd Zithromax (500 mg day 1, then 250 mg daily x 4 days); 2.  Sinus pressure-Rx'd Prednisone 60 mg daily x 5 days; 3.  Cough,  unspecified type-Rx'd Tessalon 200 mg capsules 3 times daily, as needed for cough. Instructed patient to take medications as directed with food to completion.   Advised may take Tessalon capsules daily or as needed for cough.  Encouraged to increase daily water intake to 64 ounces per day while taking these medications.  Advised if symptoms worsen and/or unresolved please follow-up PCP or here for further evaluation.  Patient discharged home, hemodynamically stable. Final Clinical Impressions(s) / UC Diagnoses   Final diagnoses:  Cough, unspecified type  Subacute maxillary sinusitis  Sinus pressure     Discharge Instructions      Instructed patient to take medications as directed with food to completion.  Advised may take Tessalon capsules daily or as needed for cough.  Encouraged to increase daily water intake to 64 ounces per day while taking these medications.  Advised if symptoms worsen and/or unresolved please follow-up PCP or here for further evaluation.     ED Prescriptions     Medication Sig Dispense Auth. Provider   azithromycin (ZITHROMAX) 250 MG tablet Take 1 tablet (250 mg total) by mouth daily. Take first 2 tablets together, then 1 every day until finished. 6 tablet Trevor Iha, FNP   predniSONE (DELTASONE) 20 MG tablet Take 3 tabs PO daily x 5 days. 15 tablet Trevor Iha, FNP   benzonatate (TESSALON) 200 MG capsule Take 1 capsule (200 mg total) by mouth 3 (three) times daily as needed for up to 7 days. 40 capsule Trevor Iha, FNP      PDMP not reviewed this encounter.   Trevor Iha, FNP 07/13/23 [phone]

## 2023-09-29 ENCOUNTER — Encounter: Payer: Medicaid Other | Admitting: Bariatrics

## 2023-10-01 ENCOUNTER — Ambulatory Visit
Admission: EM | Admit: 2023-10-01 | Discharge: 2023-10-01 | Disposition: A | Discharge status: Home / Self Care | Payer: Medicaid Other | Attending: Family Medicine | Admitting: Family Medicine

## 2023-10-01 DIAGNOSIS — R070 Pain in throat: Secondary | ICD-10-CM | POA: Diagnosis not present

## 2023-10-01 DIAGNOSIS — J069 Acute upper respiratory infection, unspecified: Secondary | ICD-10-CM | POA: Diagnosis not present

## 2023-10-01 LAB — POCT RAPID STREP A (OFFICE): Rapid Strep A Screen: NEGATIVE

## 2023-10-01 MED ORDER — PROMETHAZINE-DM 6.25-15 MG/5ML PO SYRP: 5.0000 mL | ORAL_SOLUTION | Freq: Three times a day (TID) | ORAL | 0 refills | Status: DC | PRN

## 2023-10-01 MED ORDER — CETIRIZINE HCL 10 MG PO TABS: 10.0000 mg | ORAL_TABLET | Freq: Every day | ORAL | 0 refills | Status: AC

## 2023-10-01 MED ORDER — PSEUDOEPHEDRINE HCL 60 MG PO TABS: 60.0000 mg | ORAL_TABLET | Freq: Three times a day (TID) | ORAL | 0 refills | Status: DC | PRN

## 2023-10-01 NOTE — Discharge Instructions (Addendum)
 We will manage this as a viral upper respiratory illness. For sore throat or cough try using a honey-based tea. Use 3 teaspoons of honey with juice squeezed from half lemon. Place shaved pieces of ginger into 1/2-1 cup of water and warm over stove top. Then mix the ingredients and repeat every 4 hours as needed. Please take ibuprofen 600mg  every 6 hours with food alternating with OR taken together with Tylenol 500mg -650mg  every 6 hours for throat pain, fevers, aches and pains. Hydrate very well with at least 2 liters of water. Eat light meals such as soups (chicken and noodles, vegetable, chicken and wild rice).  Do not eat foods that you are allergic to.  Taking an antihistamine like Zyrtec (10mg  daily) can help against postnasal drainage, sinus congestion which can cause sinus pain, sinus headaches, throat pain, painful swallowing, coughing.  You can take this together with pseudoephedrine (Sudafed) at a dose of 60 mg 3 times a day or twice daily as needed for the same kind of nasal drip, congestion.  Use cough syrup as needed.

## 2023-10-01 NOTE — ED Provider Notes (Signed)
[facility]  Note:  This document was prepared using Conservation officer, historic buildings and may include unintentional dictation errors.  MRN: [id number] DOB: [birth year]  Subjective:   Tanya Mccoy is a 47 y.o. female presenting for 4-day history of malaise, sinus congestion, sinus drainage, throat pain.  Has a history of allergic rhinitis, does not take anything consistently for this.  Has had a cough.  No chest pain, shortness of breath or wheezing.  No history of asthma or respiratory disorders.  No smoking of any kind including cigarettes, cigars, vaping, marijuana use.    No current facility-administered medications for this encounter.  Current Outpatient Medications:    Pseudoeph-Doxylamine-DM-APAP (DAYQUIL/NYQUIL COLD/FLU RELIEF PO), Take by mouth., Disp: , Rfl:    azithromycin (ZITHROMAX) 250 MG tablet, Take 1 tablet (250 mg total) by mouth daily. Take first 2 tablets together, then 1 every day until finished., Disp: 6 tablet, Rfl: 0   Multiple Vitamin (MULTIVITAMIN WITH MINERALS) TABS tablet, Take 1 tablet by mouth daily., Disp: , Rfl:    predniSONE (DELTASONE) 20 MG tablet, Take 3 tabs PO daily x 5 days., Disp: 15 tablet, Rfl: 0   No Known Allergies  Past Medical History:  Diagnosis Date   Allergy    Migraine with aura    Muscle strain of left upper arm 06/13/2021     Past Surgical History:  Procedure Laterality Date   CESAREAN SECTION      Family History  Problem Relation Age of Onset   Diabetes Mother    Healthy Father    Autoimmune disease Sister    Thyroid disease Sister    Breast cancer Neg Hx     Social History   Tobacco Use   Smoking status: Never   Smokeless tobacco: Never  Vaping Use   Vaping status: Never Used  Substance Use Topics   Alcohol use: Yes    Comment: occasionally   Drug use: Never    ROS   Objective:   Vitals: BP 117/75 (BP Location: Left Arm)   Pulse 87   Temp 98.3 F (36.8 C) (Oral)    Resp 16   SpO2 98%   Physical Exam Constitutional:      General: She is not in acute distress.    Appearance: Normal appearance. She is well-developed and normal weight. She is not ill-appearing, toxic-appearing or diaphoretic.  HENT:     Head: Normocephalic and atraumatic.     Right Ear: Tympanic membrane, ear canal and external ear normal. No drainage or tenderness. No middle ear effusion. There is no impacted cerumen. Tympanic membrane is not erythematous or bulging.     Left Ear: Tympanic membrane, ear canal and external ear normal. No drainage or tenderness.  No middle ear effusion. There is no impacted cerumen. Tympanic membrane is not erythematous or bulging.     Nose: Congestion present. No rhinorrhea.     Mouth/Throat:     Mouth: Mucous membranes are moist. No oral lesions.     Pharynx: No pharyngeal swelling, oropharyngeal exudate, posterior oropharyngeal erythema or uvula swelling.     Tonsils: No tonsillar exudate or tonsillar abscesses.  Eyes:     General: No scleral icterus.       Right eye: No discharge.        Left eye: No discharge.     Extraocular Movements: Extraocular movements intact.     Right eye: Normal extraocular motion.     Left eye: Normal extraocular motion.  Conjunctiva/sclera: Conjunctivae normal.  Cardiovascular:     Rate and Rhythm: Normal rate and regular rhythm.     Heart sounds: Normal heart sounds. No murmur heard.    No friction rub. No gallop.  Pulmonary:     Effort: Pulmonary effort is normal. No respiratory distress.     Breath sounds: No stridor. No wheezing, rhonchi or rales.  Chest:     Chest wall: No tenderness.  Musculoskeletal:     Cervical back: Normal range of motion and neck supple.  Lymphadenopathy:     Cervical: No cervical adenopathy.  Skin:    General: Skin is warm and dry.  Neurological:     General: No focal deficit present.     Mental Status: She is alert and oriented to person, place, and time.  Psychiatric:         Mood and Affect: Mood normal.        Behavior: Behavior normal.    Results for orders placed or performed during the hospital encounter of 10/01/23 (from the past 24 hour(s))  POCT rapid strep A     Status: None   Collection Time: 10/01/23  9:04 AM  Result Value Ref Range   Rapid Strep A Screen Negative Negative     Assessment and Plan :   PDMP not reviewed this encounter.  1. Viral upper respiratory infection   2. Throat pain    Strep culture pending.  Deferred imaging given clear cardiopulmonary exam, hemodynamically stable vital signs.  Suspect viral URI, viral syndrome. Physical exam findings reassuring and vital signs stable for discharge. Advised supportive care, offered symptomatic relief. Counseled patient on potential for adverse effects with medications prescribed/recommended today, ER and return-to-clinic precautions discussed, patient verbalized understanding.     Wallis Bamberg, PA-C 10/01/23 1023

## 2023-10-01 NOTE — ED Triage Notes (Signed)
Pt reports sore throat and congestion x 4 days. Dayquil and Nyquil gives no relief.

## 2023-10-04 DIAGNOSIS — Z0289 Encounter for other administrative examinations: Secondary | ICD-10-CM

## 2023-10-04 LAB — CULTURE, GROUP A STREP (THRC)

## 2023-10-05 ENCOUNTER — Ambulatory Visit: Payer: Medicaid Other | Admitting: Bariatrics

## 2023-10-05 ENCOUNTER — Encounter: Payer: Self-pay | Admitting: Bariatrics

## 2023-10-05 VITALS — BP 120/78 | HR 75 | Temp 98.0°F | Ht 62.5 in | Wt 194.0 lb

## 2023-10-05 DIAGNOSIS — E66811 Obesity, class 1: Secondary | ICD-10-CM

## 2023-10-05 DIAGNOSIS — E7800 Pure hypercholesterolemia, unspecified: Secondary | ICD-10-CM

## 2023-10-05 DIAGNOSIS — Z6834 Body mass index (BMI) 34.0-34.9, adult: Secondary | ICD-10-CM | POA: Diagnosis not present

## 2023-10-05 DIAGNOSIS — Z833 Family history of diabetes mellitus: Secondary | ICD-10-CM

## 2023-10-05 NOTE — Progress Notes (Signed)
Office: [phone]  /  Fax: [phone]   Initial Visit  Tanya Mccoy was seen in clinic today to evaluate for obesity. She is interested in losing weight to improve overall health and reduce the risk of weight related complications. She presents today to review program treatment options, initial physical assessment, and evaluation.     She was referred by: Self-Referral  When asked what else they would like to accomplish? She states: Adopt healthier eating patterns and Improve energy levels and physical activity  When asked how has your weight affected you? She states: Having fatigue and Having poor endurance  Some associated conditions: Other: Adult onset diabetes in mother.   Contributing factors: Family history of obesity and peri-menopause  Weight promoting medications identified: None  Current nutrition plan: Low-carb, High-protein, Portion control / smart choices, and Other: keto friendly Mediterranean   Current level of physical activity: Walking and Step counting  Current or previous pharmacotherapy: None  Response to medication: Never tried medications   Past medical history includes:   Past Medical History:  Diagnosis Date   Allergy    Migraine with aura    Muscle strain of left upper arm 06/13/2021     Objective:   BP 120/78   Pulse 75   Temp 98 F (36.7 C)   Ht 5' 2.5" (1.588 m)   Wt 194 lb (88 kg)   SpO2 97%   BMI 34.92 kg/m  She was weighed on the bioimpedance scale: Body mass index is 34.92 kg/m.  Peak Weight:210 lbs , Body Fat%:40.6 %, Visceral Fat Rating:10, Weight trend over the last 12 months: fluctuating.   General:  Alert, oriented and cooperative. Patient is in no acute distress.  Respiratory: Normal respiratory effort, no problems with respiration noted  Extremities: Normal range of motion.    Mental Status: Normal mood and affect. Normal behavior. Normal judgment and thought content.   DIAGNOSTIC DATA  REVIEWED:  BMET    Component Value Date/Time   NA 139 04/07/2023 0914   K 4.2 04/07/2023 0914   CL 104 04/07/2023 0914   CO2 22 04/07/2023 0914   GLUCOSE 85 04/07/2023 0914   BUN 11 04/07/2023 0914   CREATININE 0.69 04/07/2023 0914   CALCIUM 9.4 04/07/2023 0914   GFRNONAA 102 11/21/2019 1438   GFRAA 118 11/21/2019 1438   Lab Results  Component Value Date   HGBA1C 5.0 04/07/2023   HGBA1C 5.1 11/21/2019   No results found for: "INSULIN" CBC    Component Value Date/Time   WBC 4.1 04/07/2023 0914   WBC 9.4 08/30/2010 0523   RBC 4.57 04/07/2023 0914   RBC 3.41 (L) 08/30/2010 0523   HGB 13.0 04/07/2023 0914   HCT 40.4 04/07/2023 0914   PLT 284 04/07/2023 0914   MCV 88 04/07/2023 0914   MCH 28.4 04/07/2023 0914   MCH 29.4 08/30/2010 0523   MCHC 32.2 04/07/2023 0914   MCHC 34.1 08/30/2010 0523   RDW 13.2 04/07/2023 0914   Iron/TIBC/Ferritin/ %Sat No results found for: "IRON", "TIBC", "FERRITIN", "IRONPCTSAT" Lipid Panel     Component Value Date/Time   CHOL 192 04/07/2023 0914   TRIG 64 04/07/2023 0914   HDL 56 04/07/2023 0914   CHOLHDL 3.4 04/07/2023 0914   LDLCALC 124 (H) 04/07/2023 0914   Hepatic Function Panel     Component Value Date/Time   PROT 6.8 04/07/2023 0914   ALBUMIN 4.3 04/07/2023 0914   AST 14 04/07/2023 0914   ALT 23 04/07/2023 0914  ALKPHOS 80 04/07/2023 0914   BILITOT <0.2 04/07/2023 0914      Component Value Date/Time   TSH 1.170 04/07/2023 0914     Assessment and Plan:   Family history of diabetes mellitus in mother:  Patient denies any history of prediabetes or diabetes at this time.   Plan: Will check HgbA1c and insulin levels at their first visit.   Elevated LDL cholesterol level:  Her LDL was slightly elevated.  Plan: Will check cholesterol levels.  Class 1 obesity due to excess calories with serious comorbidity and body mass index (BMI) of 34.0 to 34.9 in adult     Generalized Obesity: Current BMI 34    Obesity  Treatment / Action Plan:  Patient will work on garnering support from family and friends to begin weight loss journey. Will work on eliminating or reducing the presence of highly palatable, calorie dense foods in the home. Will complete provided nutritional and psychosocial assessment questionnaire before the next appointment. Will be scheduled for indirect calorimetry to determine resting energy expenditure in a fasting state.  This will allow [place] to create a reduced calorie, high-protein meal plan to promote loss of fat mass while preserving muscle mass. Counseled on the health benefits of losing 5%-15% of total body weight. Was counseled on nutritional approaches to weight loss and benefits of reducing processed foods and consuming plant-based foods and high quality protein as part of nutritional weight management. Was counseled on pharmacotherapy and role as an adjunct in weight management.   Obesity Education Performed Today:  She was weighed on the bioimpedance scale and results were discussed and documented in the synopsis.  We discussed obesity as a disease and the importance of a more detailed evaluation of all the factors contributing to the disease.  We discussed the importance of long term lifestyle changes which include nutrition, exercise and behavioral modifications as well as the importance of customizing this to her specific health and social needs.  We discussed the benefits of reaching a healthier weight to alleviate the symptoms of existing conditions and reduce the risks of the biomechanical, metabolic and psychological effects of obesity.  Discussed New Patient/Late Arrival, and Cancellation Policies. Patient voiced understanding and allowed to ask questions.   Tanya Mccoy appears to be in the action stage of change and states they are ready to start intensive lifestyle modifications and behavioral modifications.  30 minutes was spent today on this visit  including the above counseling, pre-visit chart review, and post-visit documentation.  Reviewed by clinician on day of visit: allergies, medications, problem list, medical history, surgical history, family history, social history, and previous encounter notes.    Tanya Mccoy.

## 2023-10-07 ENCOUNTER — Ambulatory Visit: Payer: Medicaid Other | Admitting: Bariatrics

## 2023-10-07 ENCOUNTER — Encounter: Payer: Self-pay | Admitting: Bariatrics

## 2023-10-07 VITALS — BP 129/76 | HR 77 | Temp 97.7°F | Ht 62.5 in | Wt 194.0 lb

## 2023-10-07 DIAGNOSIS — R0602 Shortness of breath: Secondary | ICD-10-CM | POA: Diagnosis not present

## 2023-10-07 DIAGNOSIS — Z833 Family history of diabetes mellitus: Secondary | ICD-10-CM | POA: Diagnosis not present

## 2023-10-07 DIAGNOSIS — E669 Obesity, unspecified: Secondary | ICD-10-CM | POA: Diagnosis not present

## 2023-10-07 DIAGNOSIS — Z6834 Body mass index (BMI) 34.0-34.9, adult: Secondary | ICD-10-CM

## 2023-10-07 DIAGNOSIS — Z1331 Encounter for screening for depression: Secondary | ICD-10-CM | POA: Diagnosis not present

## 2023-10-07 DIAGNOSIS — E7800 Pure hypercholesterolemia, unspecified: Secondary | ICD-10-CM | POA: Diagnosis not present

## 2023-10-07 DIAGNOSIS — E6609 Other obesity due to excess calories: Secondary | ICD-10-CM

## 2023-10-07 DIAGNOSIS — Z0000 Encounter for general adult medical examination without abnormal findings: Secondary | ICD-10-CM

## 2023-10-07 DIAGNOSIS — E559 Vitamin D deficiency, unspecified: Secondary | ICD-10-CM | POA: Diagnosis not present

## 2023-10-07 DIAGNOSIS — E66811 Obesity, class 1: Secondary | ICD-10-CM

## 2023-10-07 DIAGNOSIS — R5383 Other fatigue: Secondary | ICD-10-CM

## 2023-10-07 NOTE — Progress Notes (Signed)
.      At a Glance:  Vitals Temp: 97.7 F (36.5 C) BP: 129/76 Pulse Rate: 77 SpO2: 99 %   Anthropometric Measurements Height: 5' 2.5" (1.588 m) Weight: 194 lb (88 kg) BMI (Calculated): 34.9 Starting Weight: 194lb Peak Weight: 210lb   Body Composition  Body Fat %: 40 % Fat Mass (lbs): 77.6 lbs Muscle Mass (lbs): 110.8 lbs Total Body Water (lbs): 76.6 lbs Visceral Fat Rating : 10   Other Clinical Data RMR: 2016 Fasting: no Labs: no Today's Visit #: 1 Starting Date: 10/07/23    EKG: Normal sinus rhythm, rate 87.  Indirect Calorimeter:   Resting Metabolic Rate ( RMR):  RMR (actual): 2016 kcal RMR (calculated):1608 kcal The calculated basal metabolic rate is 9563 thus her basal metabolic rate is better than expected.  Plan:   Indirect calorimeter completed, interpreted and reviewed with patient today and allowed to ask questions.  Discussed the implications for the chosen plan and exercise based on the RMR reading.  Will consider repeating the RMR in the future based on weight loss.    Chief Complaint:  Obesity   Subjective:  Tanya Mccoy (MR# [id number]) is a 47 y.o. female who presents for evaluation and treatment of obesity and related comorbidities.   Tanya Mccoy is currently in the action stage of change and ready to dedicate time achieving and maintaining a healthier weight. Tanya Mccoy is interested in becoming our patient and working on intensive lifestyle modifications including (but not limited to) diet and exercise for weight loss.  Tanya Mccoy has been struggling with her weight. She has been unsuccessful in either losing weight, maintaining weight loss, or reaching her healthy weight goal.  Tanya Mccoy's habits were reviewed today and are as follows: Her family eats meals together, she thinks her family will eat healthier with her, she started gaining weight at age 89. , she has significant food cravings issues, and she has problems with  excessive hunger.   She started gaining weight at age 72. During a time of stress.   Current or previous pharmacotherapy: None  Response to medication: Never tried medications  Other Fatigue Tanya Mccoy admits to daytime somnolence and denies waking up still tired. Patient has a history of symptoms of no sleep issues. Tanya Mccoy generally gets 7 hours of sleep per night, and states that she has generally restful sleep. Snoring is not present. Apneic episodes are not present. Epworth Sleepiness Score is 9..   Shortness of Breath Tanya Mccoy notes increasing shortness of breath with exercising and seems to be worsening over time with weight gain. She notes getting out of breath sooner with activity than she used to. This has not gotten worse recently. Tanya Mccoy denies shortness of breath at rest or orthopnea.  Depression Screen [name] Food and Mood (modified PHQ-9) score was 9. 5-9 mild depression     04/07/2023    8:32 AM  Depression screen PHQ 2/9  Decreased Interest 0  Down, Depressed, Hopeless 0  PHQ - 2 Score 0  Altered sleeping 1  Tired, decreased energy 1  Change in appetite 0  Feeling bad or failure about yourself  0  Trouble concentrating 0  Moving slowly or fidgety/restless 0  Suicidal thoughts 0  PHQ-9 Score 2  Difficult doing work/chores Not difficult at all     Assessment and Plan:   Other Fatigue Tanya Mccoy does feel that her weight is causing her energy to be lower than it should be. Fatigue may be related to obesity, depression or many  other causes. Labs will be ordered, and in the meanwhile, Tanya Mccoy will focus on self care including making healthy food choices, increasing physical activity and focusing on stress reduction.  Shortness of Breath Tanya Mccoy does not feel that she gets out of breath more easily that she used to when she exercises. Tanya Mccoy's shortness of breath appears to be obesity related and exercise induced. She has agreed to work on weight loss and  gradually increase exercise to treat her exercise induced shortness of breath. Will continue to monitor closely.  Health Maintenance:   Obesity   Plan: Will do EKG, indirect calorimetry, and labs.     Vitamin D Deficiency  She is at risk for vitamin D deficiency due to obesity.  She is on biotin and MV and probiotic Lab Results  Component Value Date   VD25OH 23.3 (L) 10/22/2021   VD25OH 23.3 (L) 06/13/2021    Plan: Will check for vitamin D deficiency.   Tanya Mccoy had a positive depression screening. Depression is commonly associated with obesity and often results in emotional eating behaviors. We will monitor this closely and work on CBT to help improve the non-hunger eating patterns. Referral to Psychology may be required if no improvement is seen as she continues in our clinic.   Family history of diabetes mellitus in mother:   Patient denies any history of prediabetes or diabetes at this time. No medications.    Plan: Will check HgbA1c and insulin levels at her first visit.    Elevated LDL cholesterol level:   Her LDL was slightly elevated. No medications.   Plan: Will check cholesterol levels.   Previous labs reviewed today. Date: 04/07/23 (CMP, lipids, CBC, glucose, thyroid panel).   Labs done today CMP, Lipids, Insulin, HgbA1c, and Vit D   Generalized Obesity: BMI (Calculated): 34.9   Tanya Mccoy is currently in the action stage of change and her goal is to begin weight loss efforts. I recommend Tanya Mccoy begin the structured treatment plan as follows:  She has agreed to Category 3 Plan  Exercise goals: All adults should avoid inactivity. Some activity is better than none, and adults who participate in any amount of physical activity, gain some health benefits.  Behavioral modification strategies:increasing lean protein intake, decreasing simple carbohydrates, increasing vegetables, increase H2O intake, increase high fiber foods, no skipping meals, meal planning  and cooking strategies, keeping healthy foods in the home, and planning for success  She was informed of the importance of frequent follow-up visits to maximize her success with intensive lifestyle modifications for her multiple health conditions. She was informed we would discuss her lab results at her next visit unless there is a critical issue that needs to be addressed sooner. Tanya Mccoy agreed to keep her next visit at the agreed upon time to discuss these results.  Objective:  General: Cooperative, alert, well developed, in no acute distress. HEENT: Conjunctivae and lids unremarkable. Cardiovascular: Regular rhythm.  Lungs: Normal work of breathing. Neurologic: No focal deficits.   Lab Results  Component Value Date   CREATININE 0.69 04/07/2023   BUN 11 04/07/2023   NA 139 04/07/2023   K 4.2 04/07/2023   CL 104 04/07/2023   CO2 22 04/07/2023   Lab Results  Component Value Date   ALT 23 04/07/2023   AST 14 04/07/2023   ALKPHOS 80 04/07/2023   BILITOT <0.2 04/07/2023   Lab Results  Component Value Date   HGBA1C 5.0 04/07/2023   HGBA1C 5.3 10/22/2021   HGBA1C 5.7 (H) 06/13/2021  HGBA1C 5.1 11/21/2019   No results found for: "INSULIN" Lab Results  Component Value Date   TSH 1.170 04/07/2023   Lab Results  Component Value Date   CHOL 192 04/07/2023   HDL 56 04/07/2023   LDLCALC 124 (H) 04/07/2023   TRIG 64 04/07/2023   CHOLHDL 3.4 04/07/2023   Lab Results  Component Value Date   WBC 4.1 04/07/2023   HGB 13.0 04/07/2023   HCT 40.4 04/07/2023   MCV 88 04/07/2023   PLT 284 04/07/2023   No results found for: "IRON", "TIBC", "FERRITIN"  Attestation Statements:  Applicable history such as the following:  allergies, medications, problem list, medical history, surgical history, family history, social history, and previous encounter notes reviewed by clinician on day of visit:   Time spent on visit including the items listed below was 42 minutes.  -preparing to  see the patient (e.g., review of tests, history, previous notes) -obtaining and/or reviewing separately obtained history -counseling and educating the patient/family/caregiver -documenting clinical information in the electronic or other health record -ordering medications, tests, or procedures -independently interpreting results and communicating results to the patient/ family/caregiver -referring and communicating with other health care professionals  -care coordination   This may have been prepared with the assistance of Engineer, civil (consulting).  Occasional wrong-word or sound-a-like substitutions may have occurred due to the inherent limitations of voice recognition software.   Corinna Capra, DO

## 2023-10-08 LAB — COMPREHENSIVE METABOLIC PANEL
ALT: 24 [IU]/L (ref 0–32)
AST: 20 [IU]/L (ref 0–40)
Albumin: 4.7 g/dL (ref 3.9–4.9)
Alkaline Phosphatase: 95 [IU]/L (ref 44–121)
BUN/Creatinine Ratio: 18 (ref 9–23)
BUN: 14 mg/dL (ref 6–24)
Bilirubin Total: <0.2 mg/dL (ref 0.0–1.2)
CO2: 22 mmol/L (ref 20–29)
Calcium: 10.1 mg/dL (ref 8.7–10.2)
Chloride: 100 mmol/L (ref 96–106)
Creatinine, Ser: 0.8 mg/dL (ref 0.57–1.00)
Globulin, Total: 3 g/dL (ref 1.5–4.5)
Glucose: 75 mg/dL (ref 70–99)
Potassium: 4.3 mmol/L (ref 3.5–5.2)
Sodium: 137 mmol/L (ref 134–144)
Total Protein: 7.7 g/dL (ref 6.0–8.5)
eGFR: 91 mL/min/{1.73_m2} (ref >59)

## 2023-10-08 LAB — LIPID PANEL WITH LDL/HDL RATIO
Cholesterol, Total: 203 mg/dL — ABNORMAL HIGH (ref 100–199)
HDL: 68 mg/dL (ref >39)
LDL Chol Calc (NIH): 124 mg/dL — ABNORMAL HIGH (ref 0–99)
LDL/HDL Ratio: 1.8 {ratio} (ref 0.0–3.2)
Triglycerides: 61 mg/dL (ref 0–149)
VLDL Cholesterol Cal: 11 mg/dL (ref 5–40)

## 2023-10-08 LAB — HEMOGLOBIN A1C
Est. average glucose Bld gHb Est-mCnc: 114 mg/dL
Hgb A1c MFr Bld: 5.6 % (ref 4.8–5.6)

## 2023-10-08 LAB — VITAMIN D 25 HYDROXY (VIT D DEFICIENCY, FRACTURES): Vit D, 25-Hydroxy: 25.9 ng/mL — ABNORMAL LOW (ref 30.0–100.0)

## 2023-10-08 LAB — INSULIN, RANDOM: INSULIN: 12.2 u[IU]/mL (ref 2.6–24.9)

## 2023-10-12 ENCOUNTER — Encounter: Payer: Self-pay | Admitting: Bariatrics

## 2023-10-12 DIAGNOSIS — E785 Hyperlipidemia, unspecified: Secondary | ICD-10-CM | POA: Insufficient documentation

## 2023-10-12 DIAGNOSIS — E559 Vitamin D deficiency, unspecified: Secondary | ICD-10-CM | POA: Insufficient documentation

## 2023-10-12 DIAGNOSIS — E7800 Pure hypercholesterolemia, unspecified: Secondary | ICD-10-CM | POA: Insufficient documentation

## 2023-10-16 ENCOUNTER — Ambulatory Visit
Admission: EM | Admit: 2023-10-16 | Discharge: 2023-10-16 | Disposition: A | Discharge status: Home / Self Care | Payer: Medicaid Other | Attending: Family Medicine | Admitting: Family Medicine

## 2023-10-16 DIAGNOSIS — J019 Acute sinusitis, unspecified: Secondary | ICD-10-CM

## 2023-10-16 DIAGNOSIS — J209 Acute bronchitis, unspecified: Secondary | ICD-10-CM

## 2023-10-16 DIAGNOSIS — J3089 Other allergic rhinitis: Secondary | ICD-10-CM | POA: Diagnosis not present

## 2023-10-16 MED ORDER — AMOXICILLIN-POT CLAVULANATE 875-125 MG PO TABS: 1.0000 | ORAL_TABLET | Freq: Two times a day (BID) | ORAL | 0 refills | Status: DC

## 2023-10-16 MED ORDER — PREDNISONE 20 MG PO TABS: 40.0000 mg | ORAL_TABLET | Freq: Every day | ORAL | 0 refills | Status: DC

## 2023-10-16 NOTE — ED Provider Notes (Signed)
[facility]    CSN: [id number] Arrival date & time: 10/16/23  1046      History   Chief Complaint Chief Complaint  Patient presents with   Cough    X3 weeks Cough, loss of voice and bilateral ear pain.     HPI Tanya Mccoy is a 47 y.o. female.   Patient states that she had a "allergy attack" about 3 weeks ago when she was managing leaves in her yard.  She states she does have seasonal environmental allergies.  She takes Zyrtec on a daily basis.  She added Sudafed for the congestion.  She has also tried a Nettie pot and saline rinses.  In spite of this she still has headache, sinus pressure, postnasal drip, and cough.  This symptoms have been lasting for 3 weeks.  She states she starting to feel tired.  The cough is getting worse and she is producing yellow sputum.  Concerned that she is not improving in spite of appropriate conservative care    Past Medical History:  Diagnosis Date   Allergy    Migraine with aura    Muscle strain of left upper arm 06/13/2021    Patient Active Problem List   Diagnosis Date Noted   Vitamin D insufficiency 10/12/2023   Elevated cholesterol 10/12/2023   Encounter to establish care 04/07/2023   Family history of diabetes mellitus in mother 04/07/2023   Seasonal allergic rhinitis 04/07/2023   General medical exam 04/07/2023   Dysmenorrhea 04/18/2022   Rash due to allergy 04/18/2022   Sinusitis 12/10/2021   Pain and numbness of upper extremity 10/22/2021   Pain in right leg 10/22/2021   Weight gain 06/13/2021   Fatigue 06/13/2021   Seasonal allergies 11/21/2019    Past Surgical History:  Procedure Laterality Date   CESAREAN SECTION      OB History     Gravida  3   Para  2   Term  2   Preterm      AB  1   Living  2      SAB  1   IAB      Ectopic      Multiple      Live Births               Home Medications    Prior to Admission medications   Medication Sig Start Date End Date  Taking? Authorizing Provider  amoxicillin-clavulanate (AUGMENTIN) 875-125 MG tablet Take 1 tablet by mouth every 12 (twelve) hours. 10/16/23  Yes Eustace Moore, MD  BIOTIN PO Take by mouth.   Yes [provider]  cetirizine (ZYRTEC ALLERGY) 10 MG tablet Take 1 tablet (10 mg total) by mouth daily. 10/01/23  Yes Wallis Bamberg, PA-C  predniSONE (DELTASONE) 20 MG tablet Take 2 tablets (40 mg total) by mouth daily with breakfast. 10/16/23  Yes Eustace Moore, MD  Probiotic Product (PROBIOTIC DAILY PO) Take by mouth.   Yes [provider]    Family History Family History  Problem Relation Age of Onset   Diabetes Mother    Healthy Father    Autoimmune disease Sister    Thyroid disease Sister    Breast cancer Neg Hx     Social History Social History   Tobacco Use   Smoking status: Never   Smokeless tobacco: Never  Vaping Use   Vaping status: Never Used  Substance Use Topics   Alcohol use: Yes    Comment: occasionally  Drug use: Never     Allergies   Patient has no known allergies.   Review of Systems Review of Systems See HPI  Physical Exam Triage Vital Signs ED Triage Vitals  Encounter Vitals Group     BP 10/16/23 1116 127/78     Systolic BP Percentile --      Diastolic BP Percentile --      Pulse Rate 10/16/23 1116 84     Resp 10/16/23 1116 17     Temp 10/16/23 1116 98.5 F (36.9 C)     Temp Source 10/16/23 1116 Oral     SpO2 10/16/23 1116 95 %     Weight 10/16/23 1113 194 lb (88 kg)     Height 10/16/23 1113 5' 2.5" (1.588 m)     Head Circumference --      Peak Flow --      Pain Score 10/16/23 1113 7     Pain Loc --      Pain Education --      Exclude from Growth Chart --    No data found.  Updated Vital Signs BP 127/78 (BP Location: Right Arm)   Pulse 84   Temp 98.5 F (36.9 C) (Oral)   Resp 17   Ht 5' 2.5" (1.588 m)   Wt 88 kg   LMP 09/26/2023   SpO2 95%   BMI 34.92 kg/m       Physical Exam Constitutional:       General: She is not in acute distress.    Appearance: She is well-developed.  HENT:     Head: Normocephalic and atraumatic.     Right Ear: Tympanic membrane normal.     Left Ear: Tympanic membrane normal.     Nose: Congestion and rhinorrhea present.     Comments: Facial sinuses are tender    Mouth/Throat:     Pharynx: Posterior oropharyngeal erythema present.  Eyes:     Conjunctiva/sclera: Conjunctivae normal.     Pupils: Pupils are equal, round, and reactive to light.  Cardiovascular:     Rate and Rhythm: Normal rate and regular rhythm.     Heart sounds: Normal heart sounds.  Pulmonary:     Effort: Pulmonary effort is normal. No respiratory distress.     Breath sounds: Normal breath sounds.  Abdominal:     General: There is no distension.     Palpations: Abdomen is soft.  Musculoskeletal:        General: Normal range of motion.     Cervical back: Normal range of motion.  Skin:    General: Skin is warm and dry.  Neurological:     Mental Status: She is alert.      UC Treatments / Results  Labs (all labs ordered are listed, but only abnormal results are displayed) Labs Reviewed - No data to display  EKG   Radiology No results found.  Procedures Procedures (including critical care time)  Medications Ordered in UC Medications - No data to display  Initial Impression / Assessment and Plan / UC Course  I have reviewed the triage vital signs and the nursing notes.  Pertinent labs & imaging results that were available during my care of the patient were reviewed by me and considered in my medical decision making (see chart for details).      Final Clinical Impressions(s) / UC Diagnoses   Final diagnoses:  Acute bronchitis, unspecified organism  Environmental and seasonal allergies  Acute sinusitis with symptoms greater than 10  days     Discharge Instructions      Make sure you are drinking lots of fluids Take the Augmentin antibiotic 2 times a day.  You  may wish to take a probiotic while on Augmentin Take prednisone twice a day for 5 days May take over-the-counter cough and cold medicines as needed See your doctor if not improving by next week    ED Prescriptions     Medication Sig Dispense Auth. Provider   amoxicillin-clavulanate (AUGMENTIN) 875-125 MG tablet Take 1 tablet by mouth every 12 (twelve) hours. 14 tablet Eustace Moore, MD   predniSONE (DELTASONE) 20 MG tablet Take 2 tablets (40 mg total) by mouth daily with breakfast. 10 tablet Eustace Moore, MD      PDMP not reviewed this encounter.   Eustace Moore, MD 10/16/23 [phone]

## 2023-10-16 NOTE — Discharge Instructions (Signed)
Make sure you are drinking lots of fluids Take the Augmentin antibiotic 2 times a day.  You may wish to take a probiotic while on Augmentin Take prednisone twice a day for 5 days May take over-the-counter cough and cold medicines as needed See your doctor if not improving by next week

## 2023-10-16 NOTE — ED Triage Notes (Signed)
Pt states that she has a cough, loss of voice, and bilateral ear pain. X3 weeks  Pt states that she also has a headache.

## 2023-10-21 ENCOUNTER — Ambulatory Visit: Payer: Medicaid Other | Admitting: Bariatrics

## 2023-10-21 ENCOUNTER — Encounter: Payer: Self-pay | Admitting: Bariatrics

## 2023-10-21 VITALS — BP 133/78 | HR 71 | Temp 97.6°F | Ht 62.5 in | Wt 199.0 lb

## 2023-10-21 DIAGNOSIS — E559 Vitamin D deficiency, unspecified: Secondary | ICD-10-CM | POA: Diagnosis not present

## 2023-10-21 DIAGNOSIS — E669 Obesity, unspecified: Secondary | ICD-10-CM

## 2023-10-21 DIAGNOSIS — E7800 Pure hypercholesterolemia, unspecified: Secondary | ICD-10-CM

## 2023-10-21 DIAGNOSIS — Z6835 Body mass index (BMI) 35.0-35.9, adult: Secondary | ICD-10-CM

## 2023-10-21 MED ORDER — QSYMIA 3.75-23 MG PO CP24: ORAL_CAPSULE | ORAL | 0 refills | Status: DC

## 2023-10-21 MED ORDER — VITAMIN D (ERGOCALCIFEROL) 1.25 MG (50000 UNIT) PO CAPS: 50000.0000 [IU] | ORAL_CAPSULE | ORAL | 0 refills | Status: DC

## 2023-10-21 MED ORDER — QSYMIA 7.5-46 MG PO CP24: ORAL_CAPSULE | ORAL | 0 refills | Status: DC

## 2023-10-21 NOTE — Progress Notes (Signed)
WEIGHT SUMMARY AND BIOMETRICS  Weight Lost Since Last Visit: 0  Weight Gained Since Last Visit: 5lb   Vitals Temp: 97.6 F (36.4 C) BP: 133/78 Pulse Rate: 71 SpO2: 100 %   Anthropometric Measurements Height: 5' 2.5" (1.588 m) Weight: 199 lb (90.3 kg) BMI (Calculated): 35.8 Weight at Last Visit: 194lb Weight Lost Since Last Visit: 0 Weight Gained Since Last Visit: 5lb Starting Weight: 194lb Total Weight Loss (lbs): 0 lb (0 kg)   Body Composition  Body Fat %: 40.8 % Fat Mass (lbs): 81.6 lbs Muscle Mass (lbs): 112.2 lbs Total Body Water (lbs): 79.6 lbs Visceral Fat Rating : 10   Other Clinical Data Fasting: no Labs: no Today's Visit #: 2 Starting Date: 10/07/23    OBESITY Tanya Mccoy is here to discuss her progress with her obesity treatment plan along with follow-up of her obesity related diagnoses.    Nutrition Plan: the Category 3 plan - 30% adherence.  Current exercise: none  Interim History:  She has gained 5 lbs since her first visit. She has been ill with a respiratory infection and has been taking prednisone and antibiotics which could explain her water weight gain.  Eating all of the food on the plan., Is not skipping meals, and Water intake is inadequate.   Pharmacotherapy: Tanya Mccoy is on no medications for weight loss at this time. She would like to start a medication for appetite and some cravings.  Hunger is moderately controlled.  Cravings are moderately controlled.  Assessment/Plan:   Vitamin D Deficiency Vitamin D is at goal of 50.  Most recent vitamin D level was 25.9. She is on  prescription ergocalciferol 50,000 IU weekly. Lab Results  Component Value Date   VD25OH 25.9 (L) 10/07/2023   VD25OH 23.3 (L) 10/22/2021   VD25OH 23.3 (L) 06/13/2021    Plan: Refill prescription vitamin D 50,000 IU weekly.   Elevated  Cholesterol:  Her cholesterol levels were slightly elevated.   Plan:  Will continue to monitor cholesterol levels.  Discussed information sheet on healthy and non-healthy fats.   Polyphagia Tanya Mccoy endorses excessive hunger.  Medication(s): none  Appetite is :  poorly controlled. Cravings are moderately controlled.   Plan: Medication(s):  Will start Qsymia today. Checked the PDMP, and controlled medication sheet for Phentermine/Qsymia reviewed and signed with the patient. Tanya Mccoy contraindications. Benefits and risks were discussed.   Rx: Qsymia 3.75/23 mg capsule 1 daily #30 with no refills. Qsymia 7.5/46 mg 1 daily # 30 with no refills. Sent to local pharmacy, but mail order pharmacy information was given.  Will increase water, protein and fiber to help assuage hunger.  Will minimize foods that have a high glucose index/load to minimize reactive hypoglycemia.    Generalized Obesity: Current BMI BMI (Calculated): 35.8   Pharmacotherapy Plan Start  Qsymia 7.5/46 mg 1 capsule by mouth daily in am  Tanya Mccoy is  currently in the action stage of change. As such, her goal is to continue with weight loss efforts.  She has agreed to the Category 3 plan.  Exercise goals: All adults should avoid inactivity. Some physical activity is better than none, and adults who participate in any amount of physical activity gain some health benefits.  Behavioral modification strategies: increasing lean protein intake, decreasing simple carbohydrates , no meal skipping, meal planning , increase water intake, better snacking choices, planning for success, increasing vegetables, keep healthy foods in the home, travel eating strategies, measure portion sizes, and mindful eating.  Tanya Mccoy has agreed to follow-up with our clinic in 2 weeks.     Objective:   VITALS: Per patient if applicable, see vitals. GENERAL: Alert and in no acute distress. CARDIOPULMONARY: No increased WOB. Speaking in clear  sentences.  PSYCH: Pleasant and cooperative. Speech normal rate and rhythm. Affect is appropriate. Insight and judgement are appropriate. Attention is focused, linear, and appropriate.  NEURO: Oriented as arrived to appointment on time with no prompting.   Attestation Statements:   This was prepared with the assistance of Engineer, civil (consulting).  Occasional wrong-word or sound-a-like substitutions may have occurred due to the inherent limitations of voice recognition   Tanya Capra, DO

## 2023-10-25 ENCOUNTER — Other Ambulatory Visit: Payer: Self-pay | Admitting: Bariatrics

## 2023-10-25 ENCOUNTER — Encounter: Payer: Self-pay | Admitting: Bariatrics

## 2023-10-25 DIAGNOSIS — R632 Polyphagia: Secondary | ICD-10-CM

## 2023-10-25 MED ORDER — QSYMIA 3.75-23 MG PO CP24: ORAL_CAPSULE | ORAL | 0 refills | Status: DC

## 2023-10-25 MED ORDER — QSYMIA 7.5-46 MG PO CP24: ORAL_CAPSULE | ORAL | 0 refills | Status: DC

## 2023-10-25 NOTE — Telephone Encounter (Signed)
Patient will like to use [facility] for her Qsymia. Can you please send over prescription.

## 2023-11-08 ENCOUNTER — Ambulatory Visit: Payer: Medicaid Other | Admitting: Bariatrics

## 2023-11-10 ENCOUNTER — Encounter: Payer: Self-pay | Admitting: Bariatrics

## 2023-11-10 ENCOUNTER — Ambulatory Visit: Payer: Medicaid Other | Admitting: Bariatrics

## 2023-11-10 VITALS — BP 118/75 | HR 76 | Temp 97.7°F | Ht 62.5 in | Wt 197.0 lb

## 2023-11-10 DIAGNOSIS — E669 Obesity, unspecified: Secondary | ICD-10-CM

## 2023-11-10 DIAGNOSIS — R632 Polyphagia: Secondary | ICD-10-CM | POA: Diagnosis not present

## 2023-11-10 DIAGNOSIS — Z6835 Body mass index (BMI) 35.0-35.9, adult: Secondary | ICD-10-CM | POA: Diagnosis not present

## 2023-11-10 DIAGNOSIS — E6609 Other obesity due to excess calories: Secondary | ICD-10-CM

## 2023-11-10 NOTE — Progress Notes (Signed)
 WEIGHT SUMMARY AND BIOMETRICS  Weight Lost Since Last Visit: 2lb  Weight Gained Since Last Visit: 0   Vitals Temp: 97.7 F (36.5 C) BP: 118/75 Pulse Rate: 76 SpO2: 99 %   Anthropometric Measurements Height: 5' 2.5" (1.588 m) Weight: 197 lb (89.4 kg) BMI (Calculated): 35.44 Weight at Last Visit: 199lb Weight Lost Since Last Visit: 2lb Weight Gained Since Last Visit: 0 Starting Weight: 194lb Total Weight Loss (lbs): 0 lb (0 kg)   Body Composition  Body Fat %: 37.5 % Fat Mass (lbs): 74 lbs Muscle Mass (lbs): 117.2 lbs Total Body Water (lbs): 79.6 lbs Visceral Fat Rating : 10   Other Clinical Data Fasting: no Labs: no Today's Visit #: 3 Starting Date: 10/07/23    OBESITY Tanya Mccoy is here to discuss her progress with her obesity treatment plan along with follow-up of her obesity related diagnoses.    Nutrition Plan: the Category 3 plan - 60% adherence.  Current exercise: cardiovascular workout on exercise equipment and weightlifting  Interim History:  She is down another 2 lbs since her last visit.  Eating all of the food on the plan., Protein intake is as prescribed, Is not skipping meals, and Water intake is adequate. She wants to stop the chips out of the house.    Pharmacotherapy: Tanya Mccoy is on Qsymia  7.5/46 mg 1 capsule by mouth daily in am Adverse side effects: None Hunger is moderately controlled.  Cravings are moderately controlled.  Assessment/Plan:   Polyphagia Tanya Mccoy endorses excessive hunger.  Medication(s): Qsymia . She states that she just started the medication yesterday. Effects of medication:  moderately controlled. Cravings are moderately controlled.   Plan: Medication(s): Qsymia  7.5/46 mg 1 capsule by mouth daily in am Will increase water, protein and fiber to help assuage hunger.  Will minimize foods that have a high  glucose index/load to minimize reactive hypoglycemia.    Generalized Obesity: Current BMI BMI (Calculated): 35.44   Pharmacotherapy Plan Continue  Qsymia  7.5/46 mg 1 capsule by mouth daily in am  Tanya Mccoy is currently in the action stage of change. As such, her goal is to continue with weight loss efforts.  She has agreed to the Category 3 plan.  Exercise goals: For substantial health benefits, adults should do at least 150 minutes (2 hours and 30 minutes) a week of moderate-intensity, or 75 minutes (1 hour and 15 minutes) a week of vigorous-intensity aerobic physical activity, or an equivalent combination of moderate- and vigorous-intensity aerobic activity. Aerobic activity should be performed in episodes of at least 10 minutes, and preferably, it should be spread throughout the week.  She has also started lifting on Saturday and working multiple muscle groups which is encouraged.   Behavioral modification strategies: increasing lean protein intake, no meal skipping, meal planning , increase water intake, better snacking choices, planning for success, increasing vegetables, get rid of junk food in  the home, weigh protein portions, and mindful eating.  Tanya Mccoy has agreed to follow-up with our clinic in 3 weeks.     Objective:   VITALS: Per patient if applicable, see vitals. GENERAL: Alert and in no acute distress. CARDIOPULMONARY: No increased WOB. Speaking in clear sentences.  PSYCH: Pleasant and cooperative. Speech normal rate and rhythm. Affect is appropriate. Insight and judgement are appropriate. Attention is focused, linear, and appropriate.  NEURO: Oriented as arrived to appointment on time with no prompting.   Attestation Statements:    This was prepared with the assistance of Engineer, civil (consulting).  Occasional wrong-word or sound-a-like substitutions may have occurred due to the inherent limitations of voice recognition   Kirk Peper, DO

## 2023-12-06 ENCOUNTER — Encounter: Payer: Self-pay | Admitting: Bariatrics

## 2023-12-06 ENCOUNTER — Ambulatory Visit: Payer: Medicaid Other | Admitting: Bariatrics

## 2023-12-06 VITALS — BP 122/77 | HR 84 | Temp 97.8°F | Ht 62.5 in | Wt 192.0 lb

## 2023-12-06 DIAGNOSIS — R632 Polyphagia: Secondary | ICD-10-CM

## 2023-12-06 DIAGNOSIS — Z6834 Body mass index (BMI) 34.0-34.9, adult: Secondary | ICD-10-CM | POA: Diagnosis not present

## 2023-12-06 DIAGNOSIS — E559 Vitamin D deficiency, unspecified: Secondary | ICD-10-CM | POA: Diagnosis not present

## 2023-12-06 DIAGNOSIS — E6609 Other obesity due to excess calories: Secondary | ICD-10-CM

## 2023-12-06 DIAGNOSIS — E669 Obesity, unspecified: Secondary | ICD-10-CM | POA: Diagnosis not present

## 2023-12-06 MED ORDER — QSYMIA 7.5-46 MG PO CP24: ORAL_CAPSULE | ORAL | 0 refills | Status: DC

## 2023-12-06 MED ORDER — VITAMIN D (ERGOCALCIFEROL) 1.25 MG (50000 UNIT) PO CAPS: 50000.0000 [IU] | ORAL_CAPSULE | ORAL | 0 refills | Status: DC

## 2023-12-06 NOTE — Progress Notes (Signed)
 WEIGHT SUMMARY AND BIOMETRICS  Weight Lost Since Last Visit: 5lb  Weight Gained Since Last Visit: 0   Vitals Temp: 97.8 F (36.6 C) BP: 122/77 Pulse Rate: 84 SpO2: 98 %   Anthropometric Measurements Height: 5' 2.5" (1.588 m) Weight: 192 lb (87.1 kg) BMI (Calculated): 34.54 Weight at Last Visit: 197lb Weight Lost Since Last Visit: 5lb Weight Gained Since Last Visit: 0 Starting Weight: 194lb Total Weight Loss (lbs): 2 lb (0.907 kg)   Body Composition  Body Fat %: 40.6 % Fat Mass (lbs): 78.2 lbs Muscle Mass (lbs): 108.6 lbs Total Body Water (lbs): 76.8 lbs Visceral Fat Rating : 10   Other Clinical Data Fasting: no Labs: no Today's Visit #: 4 Starting Date: 10/07/23    OBESITY Gracia is here to discuss her progress with her obesity treatment plan along with follow-up of her obesity related diagnoses.    Nutrition Plan: the Category 3 plan - 75% adherence.  Current exercise: cardiovascular workout on exercise equipment and weightlifting  Interim History:  She is down another 5 lbs since her last visit.  Eating all of the food on the plan., Protein intake is as prescribed, Is not skipping meals, Water intake is adequate., and Denies excessive cravings.   Pharmacotherapy: Margarette is on Qsymia  7.5/46 mg 1 capsule by mouth daily in am Adverse side effects: None Hunger is moderately controlled.  Cravings are moderately controlled.  Assessment/Plan:   Polyphagia Janin endorses excessive hunger.  Medication(s): Qsymia  Effects of medication:  moderately controlled. Cravings are moderately controlled.   Plan: Medication(s): Qsymia  7.5/46 mg 1 capsule by mouth daily in am Will increase water, protein and fiber to help assuage hunger.  Will minimize foods that have a high glucose index/load to minimize reactive hypoglycemia.  She will pack her  lunch. She will pack a cooler if she has to go out of town.  She will avoid to many complex carbohydrates  Vitamin D  Deficiency Vitamin D  is not at goal of 50.  Most recent vitamin D  level was 25.9. She is on  prescription ergocalciferol  50,000 IU weekly. Lab Results  Component Value Date   VD25OH 25.9 (L) 10/07/2023   VD25OH 23.3 (L) 10/22/2021   VD25OH 23.3 (L) 06/13/2021    Plan: Refill prescription vitamin D  50,000 IU weekly.     Generalized Obesity: Current BMI BMI (Calculated): 34.54   Pharmacotherapy Plan Continue and refill  Qsymia  7.5/46 mg 1 capsule by mouth daily in am  Younique is currently in the action stage of change. As such, her goal is to continue with weight loss efforts.  She has agreed to the Category 3 plan.  Exercise goals: For additional and more extensive health benefits, adults should increase their aerobic physical activity to 300 minutes (5 hours) a week of moderate-intensity, or 150 minutes a week of vigorous-intensity aerobic physical activity, or an equivalent combination  of moderate- and vigorous-intensity activity. Additional health benefits are gained by engaging in physical activity beyond this amount.  She is doing her beach body work-outs and will continue.  Behavioral modification strategies: increasing lean protein intake, no meal skipping, meal planning , increase water intake, better snacking choices, planning for success, increasing vegetables, decrease snacking , avoiding temptations, and keep healthy foods in the home.  Beverlye has agreed to follow-up with our clinic in 3 weeks.     Medications Discontinued During This Encounter  Medication Reason   Phentermine-Topiramate (QSYMIA ) 3.75-23 MG CP24 Patient Preference        Objective:   VITALS: Per patient if applicable, see vitals. GENERAL: Alert and in no acute distress. CARDIOPULMONARY: No increased WOB. Speaking in clear sentences.  PSYCH: Pleasant and cooperative. Speech  normal rate and rhythm. Affect is appropriate. Insight and judgement are appropriate. Attention is focused, linear, and appropriate.  NEURO: Oriented as arrived to appointment on time with no prompting.   Attestation Statements:    This was prepared with the assistance of Engineer, civil (consulting).  Occasional wrong-word or sound-a-like substitutions may have occurred due to the inherent limitations of voice recognition   Kirk Peper, DO

## 2023-12-14 ENCOUNTER — Ambulatory Visit: Payer: Self-pay | Admitting: Nurse Practitioner

## 2023-12-14 NOTE — Telephone Encounter (Signed)
Pt being seen in person tomorrow with Dr Nadine Counts

## 2023-12-14 NOTE — Telephone Encounter (Signed)
Chief Complaint: Influenza exposure with symptoms Symptoms: Sore throat, dry cough, nausea, dizziness, body aches, earache Frequency: Ongoing Pertinent Negatives: Patient denies chest pain, runny nose Disposition: [] ED /[] Urgent Care (no appt availability in office) / [x] Appointment(In office/virtual)/ []  [facility]/ [] Home Care/ [] Refused Recommended Disposition /[] [facility]/ []  Follow-up with PCP Additional Notes: Pt is not sure if she has a fever right now as she has not checked her temperature. Pt states she was around someone this weekend who tested positive for the flu. Pt states the symptoms started on Saturday and got worse on Sunday. Pt states her throat started to hurt yesterday. Appt scheduled for tmrw. This RN educated pt on home care, new-worsening symptoms, when to call back/seek emergent care. Pt verbalized understanding and agrees to plan.      Copied from CRM [id number]. Topic: Clinical - Red Word Triage >> Dec 14, 2023 10:05 AM Louie Casa B wrote: [facility] that prompted transfer to Nurse Triage: patient has fever sore throat chills thinks she has flu Reason for Disposition  Earache  Answer Assessment - Initial Assessment Questions 1. TYPE of EXPOSURE: "How were you exposed?" (e.g., close contact, not a close contact)     Basketball tournament this weekend and someone tested positive 2. DATE of EXPOSURE: "When did the exposure occur?" (e.g., hour, days, weeks)     Saturday 3. SYMPTOMS: "Do you have any symptoms?" (e.g., cough, fever, sore throat, difficulty breathing).     Cough, sore throat, difficulty breathing (just finished abx from URI)  Protocols used: Influenza (Flu) Exposure-A-AH, Influenza (Flu) - Seasonal-A-AH

## 2023-12-15 ENCOUNTER — Ambulatory Visit: Payer: Medicaid Other | Admitting: Family Medicine

## 2023-12-15 ENCOUNTER — Encounter: Payer: Self-pay | Admitting: Family Medicine

## 2023-12-15 VITALS — BP 110/70 | HR 87 | Temp 98.7°F | Ht 62.0 in | Wt 192.0 lb

## 2023-12-15 DIAGNOSIS — R6889 Other general symptoms and signs: Secondary | ICD-10-CM

## 2023-12-15 DIAGNOSIS — Z20828 Contact with and (suspected) exposure to other viral communicable diseases: Secondary | ICD-10-CM | POA: Diagnosis not present

## 2023-12-15 LAB — VERITOR FLU A/B WAIVED
Influenza A: NEGATIVE
Influenza B: NEGATIVE

## 2023-12-15 MED ORDER — FLUTICASONE PROPIONATE 50 MCG/ACT NA SUSP: 2.0000 | Freq: Every day | NASAL | 6 refills | Status: AC

## 2023-12-15 MED ORDER — ONDANSETRON 4 MG PO TBDP: 4.0000 mg | ORAL_TABLET | Freq: Three times a day (TID) | ORAL | 0 refills | Status: DC | PRN

## 2023-12-15 NOTE — Patient Instructions (Signed)
It appears that you have a viral upper respiratory infection (cold).  Cold symptoms can last up to 2 weeks.   ? ?- Get plenty of rest and drink plenty of fluids. ?- Try to breathe moist air. Use a cold mist humidifier. ?- Consume warm fluids (soup or tea) to provide relief for a stuffy nose and to loosen phlegm. ?- For nasal stuffiness, try saline nasal spray or a Neti Pot. Afrin nasal spray can also be used but this product should not be used longer than 3 days or it will cause rebound nasal stuffiness (worsening nasal congestion). ?- For sore throat pain relief: use chloraseptic spray, suck on throat lozenges, hard candy or popsicles; gargle with warm salt water (1/4 tsp. salt per 8 oz. of water); and eat soft, bland foods. ?- Eat a well-balanced diet. If you cannot, ensure you are getting enough nutrients by taking a daily multivitamin. ?- Avoid dairy products, as they can thicken phlegm. ?- Avoid alcohol, as it impairs your body?s immune system. ? ?CONTACT YOUR DOCTOR IF YOU EXPERIENCE ANY OF THE FOLLOWING: ?- High fever ?- Ear pain ?- Sinus-type headache ?- Unusually severe cold symptoms ?- Cough that gets worse while other cold symptoms improve ?- Flare up of any chronic lung problem, such as asthma ?- Your symptoms persist longer than 2 weeks ? ? ?

## 2023-12-15 NOTE — Progress Notes (Signed)
Subjective: CC:flu like symptoms PCP: Early, Sung Amabile, NP [id number] Tanya Mccoy is a 48 y.o. female presenting to clinic today for:  1. Flu like symptoms Patient reports onset of headache, nausea and just feeling cruddy on Sunday while she was at a sporting event.  She reports on Monday, she developed congestion, rhinorrhea, sore throat, and otalgia.  She has maybe had a mild subjective fever. Denies myalgia (outside of normal aches and pains), shortness of breath, wheezing, cough.  She reports fatigue and some loose stools this am.  She is tolerating fluids without difficulty.  Has used cough drops and alkaseltzer cough and cold.   ROS: Per HPI  No Known Allergies Past Medical History:  Diagnosis Date   Allergy    Migraine with aura    Muscle strain of left upper arm 06/13/2021    Current Outpatient Medications:    BIOTIN PO, Take by mouth., Disp: , Rfl:    cetirizine (ZYRTEC ALLERGY) 10 MG tablet, Take 1 tablet (10 mg total) by mouth daily., Disp: 30 tablet, Rfl: 0   fluticasone (FLONASE) 50 MCG/ACT nasal spray, Place 2 sprays into both nostrils daily., Disp: 16 g, Rfl: 6   Phentermine-Topiramate (QSYMIA) 7.5-46 MG CP24, Take 1 capsule daily, Disp: 30 capsule, Rfl: 0   Probiotic Product (PROBIOTIC DAILY PO), Take by mouth., Disp: , Rfl:    Vitamin D, Ergocalciferol, (DRISDOL) 1.25 MG (50000 UNIT) CAPS capsule, Take 1 capsule (50,000 Units total) by mouth every 7 (seven) days., Disp: 5 capsule, Rfl: 0 Social History   Socioeconomic History   Marital status: Widowed    Spouse name: Not on file   Number of children: Not on file   Years of education: Not on file   Highest education level: Not on file  Occupational History   Occupation: Runner, broadcasting/film/video    Comment: [employer]  Tobacco Use   Smoking status: Never   Smokeless tobacco: Never  Vaping Use   Vaping status: Never Used  Substance and Sexual Activity   Alcohol use: Yes    Comment: occasionally    Drug use: Never   Sexual activity: Not Currently    Birth control/protection: None  Other Topics Concern   Not on file  Social History Narrative   Not on file   Social Drivers of Health   Financial Resource Strain: Not on file  Food Insecurity: Not on file  Transportation Needs: Not on file  Physical Activity: Not on file  Stress: Not on file  Social Connections: Not on file  Intimate Partner Violence: Not on file   Family History  Problem Relation Age of Onset   Diabetes Mother    Healthy Father    Autoimmune disease Sister    Thyroid disease Sister    Breast cancer Neg Hx     Objective: Office vital signs reviewed. BP 110/70   Pulse 87   Temp 98.7 F (37.1 C)   Ht 5\' 2"  (1.575 m)   Wt 192 lb (87.1 kg)   LMP 12/07/2023   SpO2 95%   BMI 35.12 kg/m   Physical Examination:  General: Awake, alert, well nourished, nontoxic. No acute distress HEENT: Normal    Neck: No masses palpated. No lymphadenopathy but slight fullness of the submandibular glands    Ears: Tympanic membranes intact, normal light reflex, no erythema, no bulging    Eyes: PERRLA, extraocular membranes intact, sclera white    Nose: nasal turbinates moist and erythematous, clear nasal discharge  Throat: moist mucus membranes, mild oropharyngeal erythema, no tonsillar exudate.  Airway is patent Cardio: regular rate and rhythm, S1S2 heard, no murmurs appreciated Pulm: clear to auscultation bilaterally, no wheezes, rhonchi or rales; normal work of breathing on room air    Assessment/ Plan: 48 y.o. female   Flu-like symptoms - Plan: Veritor Flu A/B Waived, fluticasone (FLONASE) 50 MCG/ACT nasal spray, ondansetron (ZOFRAN-ODT) 4 MG disintegrating tablet  Exposure to the flu - Plan: Veritor Flu A/B Waived, fluticasone (FLONASE) 50 MCG/ACT nasal spray  Rapid flu negative. Flonase PRN congestion, push fluids. Zofran sent prn nausea. Home care instructions reviewed and reasons for reevaluation discussed.  Handout provided. Follow up prn   Raliegh Ip, DO [facility] [phone]

## 2024-01-03 ENCOUNTER — Encounter: Payer: Self-pay | Admitting: Bariatrics

## 2024-01-03 ENCOUNTER — Ambulatory Visit: Payer: Medicaid Other | Admitting: Bariatrics

## 2024-01-03 VITALS — BP 121/78 | HR 77 | Temp 97.7°F | Ht 62.5 in | Wt 194.0 lb

## 2024-01-03 DIAGNOSIS — Z6834 Body mass index (BMI) 34.0-34.9, adult: Secondary | ICD-10-CM | POA: Diagnosis not present

## 2024-01-03 DIAGNOSIS — E559 Vitamin D deficiency, unspecified: Secondary | ICD-10-CM

## 2024-01-03 DIAGNOSIS — R632 Polyphagia: Secondary | ICD-10-CM

## 2024-01-03 NOTE — Progress Notes (Signed)
 WEIGHT SUMMARY AND BIOMETRICS  Weight Lost Since Last Visit: 0lb  Weight Gained Since Last Visit: 2lb   Vitals Temp: 97.7 F (36.5 C) BP: 121/78 Pulse Rate: 77 SpO2: 99 %   Anthropometric Measurements Height: 5' 2.5" (1.588 m) Weight: 194 lb (88 kg) BMI (Calculated): 34.9 Weight at Last Visit: 192lb Weight Lost Since Last Visit: 0lb Weight Gained Since Last Visit: 2lb Starting Weight: 194lb Total Weight Loss (lbs): 0 lb (0 kg)   Body Composition  Body Fat %: 40.1 % Fat Mass (lbs): 78.2 lbs Muscle Mass (lbs): 110.8 lbs Total Body Water (lbs): 80 lbs Visceral Fat Rating : 10   Other Clinical Data Fasting: No Labs: No Today's Visit #: 5 Starting Date: 10/07/23    OBESITY Yaneisy is here to discuss her progress with her obesity treatment plan along with follow-up of her obesity related diagnoses.    Nutrition Plan: the Category 3 plan - 60% adherence.  Current exercise: cardiovascular workout on exercise equipment and swimming  Interim History:  She is up 2 lbs since her last visit. She states that is exhausted. She has not had her Qsymia in 2 weeks.  Eating all of the food on the plan., Protein intake is as prescribed, and Water intake is adequate.   Pharmacotherapy: Willo is on Qsymia 7.5/46 mg 1 capsule by mouth daily in am Adverse side effects: None Hunger is moderately controlled.  Cravings are moderately controlled.  Assessment/Plan:   1. Polyphagia Polyphagia Kalani endorses excessive hunger.  She states that she has not had her Qsymia for several weeks due to lack of supply and her appetite has gone up.  Medication(s): Qsymia Effects of medication:  moderately controlled. Cravings are moderately controlled.   Plan: Medication(s): Qsymia 7.5/46 mg 1 capsule by mouth daily in am Will increase water, protein and fiber to help  assuage hunger.  Will minimize foods that have a high glucose index/load to minimize reactive hypoglycemia.  She will continue her exercise.     Generalized Obesity: Current BMI BMI (Calculated): 34.9   Pharmacotherapy Plan Continue  Qsymia 7.5/46 mg 1 capsule by mouth daily in am  Alizey is currently in the action stage of change. As such, her goal is to continue with weight loss efforts.  She has agreed to the Category 3 plan.  Exercise goals: All adults should avoid inactivity. Some physical activity is better than none, and adults who participate in any amount of physical activity gain some health benefits.  Behavioral modification strategies: increasing lean protein intake, decreasing simple carbohydrates , no meal skipping, meal planning , increase water intake, better snacking choices, planning for success, avoiding temptations, and keep healthy foods in the home.  Risha has agreed to follow-up with our clinic in 3 weeks.      Objective:   VITALS: Per patient if applicable, see vitals. GENERAL: Alert and  in no acute distress. CARDIOPULMONARY: No increased WOB. Speaking in clear sentences.  PSYCH: Pleasant and cooperative. Speech normal rate and rhythm. Affect is appropriate. Insight and judgement are appropriate. Attention is focused, linear, and appropriate.  NEURO: Oriented as arrived to appointment on time with no prompting.   Attestation Statements:    This was prepared with the assistance of Engineer, civil (consulting).  Occasional wrong-word or sound-a-like substitutions may have occurred due to the inherent limitations of voice recognition   Corinna Capra, DO

## 2024-01-20 ENCOUNTER — Ambulatory Visit: Admitting: Bariatrics

## 2024-02-24 ENCOUNTER — Encounter: Payer: Self-pay | Admitting: Bariatrics

## 2024-02-24 ENCOUNTER — Ambulatory Visit (INDEPENDENT_AMBULATORY_CARE_PROVIDER_SITE_OTHER): Admitting: Bariatrics

## 2024-02-24 VITALS — BP 129/81 | HR 76 | Temp 97.8°F | Ht 62.5 in | Wt 197.0 lb

## 2024-02-24 DIAGNOSIS — Z6835 Body mass index (BMI) 35.0-35.9, adult: Secondary | ICD-10-CM | POA: Diagnosis not present

## 2024-02-24 DIAGNOSIS — R632 Polyphagia: Secondary | ICD-10-CM | POA: Diagnosis not present

## 2024-02-24 DIAGNOSIS — E7800 Pure hypercholesterolemia, unspecified: Secondary | ICD-10-CM | POA: Diagnosis not present

## 2024-02-24 MED ORDER — WEGOVY 0.25 MG/0.5ML ~~LOC~~ SOAJ: 0.2500 mg | SUBCUTANEOUS | 0 refills | Status: DC

## 2024-02-24 NOTE — Progress Notes (Signed)
 WEIGHT SUMMARY AND BIOMETRICS  Weight Lost Since Last Visit: 0  Weight Gained Since Last Visit: 3lb   Vitals Temp: 97.8 F (36.6 C) BP: 129/81 Pulse Rate: 76 SpO2: 99 %   Anthropometric Measurements Height: 5' 2.5" (1.588 m) Weight: 197 lb (89.4 kg) BMI (Calculated): 35.44 Weight at Last Visit: 194lb Weight Lost Since Last Visit: 0 Weight Gained Since Last Visit: 3lb Starting Weight: 194lb Total Weight Loss (lbs): 0 lb (0 kg)   Body Composition  Body Fat %: 40.8 % Fat Mass (lbs): 80.6 lbs Muscle Mass (lbs): 110.8 lbs Total Body Water (lbs): 78.8 lbs Visceral Fat Rating : 10   Other Clinical Data Fasting: no Labs: no Today's Visit #: 6 Starting Date: 10/07/23    OBESITY Brenda is here to discuss her progress with her obesity treatment plan along with follow-up of her obesity related diagnoses.    Nutrition Plan: the Category 3 plan - 50% adherence.  Current exercise: weightlifting  Interim History:  She is up 3 lbs since her last visit. She has been eating out more. She had been doing intermittent fasting and will get back some restricted eating. She had been taking the Qsymia  7.5-46 mg but stopped in that it is that effective.  Eating all of the food on the plan., Protein intake is as prescribed, Is not skipping meals, Not journaling consistently., and Water intake is adequate.   Pharmacotherapy: Jaylise had been on Qsymia  7.5/46 mg 1 capsule by mouth daily in am.  She states that the medication was not that effective and that she has stopped it.  Does not want to increase or resume this medication but would like to consider GLP-1's.  Hunger is moderately controlled.  Cravings are moderately controlled.  Assessment/Plan:   Polyphagia Rindi endorses excessive hunger.  Medication(s): Had been on Qsymia  but stopped the  medication Appetite:  moderately controlled. Cravings are moderately controlled.   Plan: Medication(s): Wegovy  0.25 mg SQ weekly Will increase water, protein and fiber to help assuage hunger.  Will minimize foods that have a high glucose index/load to minimize reactive hypoglycemia.   Teaya denies personal or family history of thyroid  cancer, history of pancreatitis, or current cholelithiasis. Kamyrn was informed of the most common side effects (nausea, constipation, diarrhea). She was given GLP-1 information sheet.  Patient informed to watch for possible symptoms, such as a lump or swelling in the neck, hoarseness, trouble swallowing, or shortness of breath. If you have any of these symptoms, tell your healthcare provide.   She has been placed on a 500 calorie deficit diet.  She has been advised to exercise at least 150 minutes per week, both cardio and resistance.    Elevated cholesterol  LDL is not at goal. Medication(s): none  Cardiovascular risk factors: obesity (BMI >= 30 kg/m2) and sedentary lifestyle  Lab Results  Component Value Date  CHOL 203 (H) 10/07/2023   HDL 68 10/07/2023   LDLCALC 124 (H) 10/07/2023   TRIG 61 10/07/2023   CHOLHDL 3.4 04/07/2023   Lab Results  Component Value Date   ALT 24 10/07/2023   AST 20 10/07/2023   ALKPHOS 95 10/07/2023   BILITOT <0.2 10/07/2023   The 10-year ASCVD risk score (Arnett DK, et al., 2019) is: 0.8%   Values used to calculate the score:     Age: 48 years     Sex: Female     Is Non-Hispanic African American: No     Diabetic: No     Tobacco smoker: No     Systolic Blood Pressure: 129 mmHg     Is BP treated: No     HDL Cholesterol: 68 mg/dL     Total Cholesterol: 203 mg/dL  Plan:  Will avoid all trans fats.  Will read labels Will minimize saturated fats except the following: low fat meats in moderation, diary, and limited dark chocolate.  She will eat out less.    Generalized Obesity: Current BMI BMI  (Calculated): 35.44   Pharmacotherapy Plan Start  Wegovy  0.25 mg SQ weekly  Zeffie is currently in the action stage of change. As such, her goal is to continue with weight loss efforts.  She has agreed to the Category 3 plan.  Exercise goals: All adults should avoid inactivity. Some physical activity is better than none, and adults who participate in any amount of physical activity gain some health benefits.  Behavioral modification strategies: increasing lean protein intake, no meal skipping, decrease eating out, meal planning , increase water intake, better snacking choices, and planning for success.  Bluma has agreed to follow-up with our clinic in 4 weeks.    Objective:   VITALS: Per patient if applicable, see vitals. GENERAL: Alert and in no acute distress. CARDIOPULMONARY: No increased WOB. Speaking in clear sentences.  PSYCH: Pleasant and cooperative. Speech normal rate and rhythm. Affect is appropriate. Insight and judgement are appropriate. Attention is focused, linear, and appropriate.  NEURO: Oriented as arrived to appointment on time with no prompting.   Attestation Statements:    This was prepared with the assistance of Engineer, civil (consulting).  Occasional wrong-word or sound-a-like substitutions may have occurred due to the inherent limitations of voice recognition   Kirk Peper, DO

## 2024-02-28 ENCOUNTER — Telehealth: Payer: Self-pay

## 2024-02-28 NOTE — Telephone Encounter (Signed)
 Started PA for [employer] via covermymeds

## 2024-02-28 NOTE — Telephone Encounter (Signed)
 Wegovy  approved 02/28/24-09/25/24 ref # [id number]

## 2024-03-23 ENCOUNTER — Ambulatory Visit: Admitting: Bariatrics

## 2024-03-28 ENCOUNTER — Telehealth: Payer: Self-pay | Admitting: Bariatrics

## 2024-03-28 NOTE — Telephone Encounter (Signed)
 Pt  calking requesting a refill on Wegovy  0.25 mg. Please advise

## 2024-04-17 ENCOUNTER — Ambulatory Visit: Admitting: Bariatrics

## 2024-05-08 ENCOUNTER — Ambulatory Visit (HOSPITAL_BASED_OUTPATIENT_CLINIC_OR_DEPARTMENT_OTHER): Admitting: Family Medicine

## 2024-05-26 ENCOUNTER — Other Ambulatory Visit (HOSPITAL_BASED_OUTPATIENT_CLINIC_OR_DEPARTMENT_OTHER): Payer: Self-pay

## 2024-05-26 ENCOUNTER — Other Ambulatory Visit (HOSPITAL_BASED_OUTPATIENT_CLINIC_OR_DEPARTMENT_OTHER): Payer: Self-pay | Admitting: Bariatrics

## 2024-05-26 DIAGNOSIS — Z1231 Encounter for screening mammogram for malignant neoplasm of breast: Secondary | ICD-10-CM

## 2024-05-29 ENCOUNTER — Ambulatory Visit (HOSPITAL_BASED_OUTPATIENT_CLINIC_OR_DEPARTMENT_OTHER)
Admission: RE | Admit: 2024-05-29 | Discharge: 2024-05-29 | Disposition: A | Discharge status: Home / Self Care | Source: Ambulatory Visit | Attending: Bariatrics | Admitting: Bariatrics

## 2024-05-29 ENCOUNTER — Encounter (HOSPITAL_BASED_OUTPATIENT_CLINIC_OR_DEPARTMENT_OTHER): Payer: Self-pay | Admitting: Radiology

## 2024-05-29 DIAGNOSIS — Z1231 Encounter for screening mammogram for malignant neoplasm of breast: Secondary | ICD-10-CM | POA: Insufficient documentation

## 2024-05-31 ENCOUNTER — Ambulatory Visit: Payer: Self-pay | Admitting: Nurse Practitioner

## 2024-06-14 ENCOUNTER — Ambulatory Visit (HOSPITAL_BASED_OUTPATIENT_CLINIC_OR_DEPARTMENT_OTHER): Admitting: Obstetrics & Gynecology

## 2024-06-14 ENCOUNTER — Encounter (HOSPITAL_BASED_OUTPATIENT_CLINIC_OR_DEPARTMENT_OTHER): Payer: Self-pay | Admitting: Obstetrics & Gynecology

## 2024-06-14 VITALS — BP 128/69 | HR 73 | Ht 62.5 in | Wt 197.0 lb

## 2024-06-14 DIAGNOSIS — Z01419 Encounter for gynecological examination (general) (routine) without abnormal findings: Secondary | ICD-10-CM

## 2024-06-14 DIAGNOSIS — N946 Dysmenorrhea, unspecified: Secondary | ICD-10-CM | POA: Diagnosis not present

## 2024-06-14 DIAGNOSIS — Z1151 Encounter for screening for human papillomavirus (HPV): Secondary | ICD-10-CM

## 2024-06-14 DIAGNOSIS — Z1211 Encounter for screening for malignant neoplasm of colon: Secondary | ICD-10-CM

## 2024-06-14 DIAGNOSIS — Z0000 Encounter for general adult medical examination without abnormal findings: Secondary | ICD-10-CM

## 2024-06-14 MED ORDER — NORETHIN ACE-ETH ESTRAD-FE 1-20 MG-MCG PO TABS: 1.0000 | ORAL_TABLET | Freq: Every day | ORAL | 1 refills | Status: DC

## 2024-06-14 NOTE — Progress Notes (Signed)
 ANNUAL EXAM Patient name: Tanya Mccoy MRN [id number]  Date of birth: [birth year] Chief Complaint:   Establish Care and Gynecologic Exam  History of Present Illness:   Tanya Mccoy is a 48 y.o. G17P2012 Hispanic female being seen today for a routine annual exam.  She is still having normal menstrual cycles but has noticed the last two months that there is a lot more pain/cramping that occurs right when her cycles begin.  Normally her cycles lasts 4 - 5 days and the first two days area the heaviest.  She uses a menstrual cup.    She took a low dosed progesterone  pill but this didn't really help.  She was prescribed micronor  in June 2023 and she took it for about 6 months.  Has ultrasound done 12/2020 due to some pelvic pain and this was normal.    She started Wegovy  but she needed to cancel appointment.  Doesn't have follow up scheduled.  She was only on it for a month so couldn't tell if was helping.    Not currently sexually active.  Patient's last menstrual period was 06/11/2024.   Last pap 12/17/2022. Results were: Normal. H/O abnormal pap: no Last mammogram: 05/29/2024. Results were: normal. Family h/o breast cancer: no Last colonoscopy: 07/08/2022. Results were: normal. Family h/o colorectal cancer: no     12/15/2023    9:42 AM 04/07/2023    8:32 AM 04/06/2022    9:35 AM 06/13/2021    2:26 PM 05/02/2021    9:14 AM  Depression screen PHQ 2/9  Decreased Interest 0 0 0 0 0  Down, Depressed, Hopeless 0 0 0 0 0  PHQ - 2 Score 0 0 0 0 0  Altered sleeping 0 1  0 0  Tired, decreased energy 0 1  0 0  Change in appetite 0 0  2 1  Feeling bad or failure about yourself  0 0  0 0  Trouble concentrating 0 0  0 0  Moving slowly or fidgety/restless 0 0  0 0  Suicidal thoughts 0 0  0 0  PHQ-9 Score 0 2  2 1   Difficult doing work/chores Not difficult at all Not difficult at all  Not difficult at all Not difficult at all        12/15/2023    9:42 AM 04/07/2023    8:32  AM 06/13/2021    2:27 PM 05/02/2021    9:14 AM  GAD 7 : Generalized Anxiety Score  Nervous, Anxious, on Edge 0 0 0 0  Control/stop worrying 0 0 0 0  Worry too much - different things 0 0 0 0  Trouble relaxing 0 0 0 0  Restless 0 0 0 0  Easily annoyed or irritable 0 0 0 0  Afraid - awful might happen 0 0 0 0  Total GAD 7 Score 0 0 0 0  Anxiety Difficulty Not difficult at all Not difficult at all  Not difficult at all     Review of Systems:   Pertinent items are noted in HPI Denies any headaches, blurred vision, fatigue, shortness of breath, chest pain, abdominal pain, abnormal vaginal discharge/itching/odor/irritation, problems with periods, bowel movements, urination, or intercourse unless otherwise stated above. Pertinent History Reviewed:  Reviewed past medical,surgical, social and family history.  Reviewed problem list, medications and allergies. Physical Assessment:   Vitals:   06/14/24 1347  BP: 128/69  Pulse: 73  SpO2: 99%  Weight: 197 lb (89.4 kg)  Height: 5' 2.5 (1.588  m)  Body mass index is 35.46 kg/m.        Physical Examination:   General appearance - well appearing, and in no distress  Mental status - alert, oriented to person, place, and time  Psych:  She has a normal mood and affect  Skin - warm and dry, normal color, no suspicious lesions noted  Chest - effort normal, all lung fields clear to auscultation bilaterally  Heart - normal rate and regular rhythm  Neck:  midline trachea, no thyromegaly or nodules  Breasts - breasts appear normal, no suspicious masses, no skin or nipple changes or  axillary nodes  Abdomen - soft, nontender, nondistended, no masses or organomegaly  Pelvic - VULVA: deferred per pt request   VAGINA: deferred  Rectal - deferred  Extremities:  No swelling or varicosities noted  Chaperone present for exam  No results found for this or any previous visit (from the past 24 hours).  Assessment & Plan:  1. Well woman exam without  gynecological exam (Primary) - Pap smear 2023 neg with neg HR HPV - Mammogram 05/2024 - Colonoscopy referral placed.  Negative cologuard 2022.   - lab work done will be done with Dr. Penne in October at new pt appointment - vaccines reviewed/updated  2. Colon cancer screening - Ambulatory referral to Gastroenterology  3. Dysmenorrhea - options for treatment discussed.  She would like to try OCPs.  Risks and side effects discussed including elevated BP, DVT, PE, stroke.  Advised to call with any concerns.  Recheck 3 months.   - norethindrone -ethinyl estradiol -FE (LOESTRIN FE) 1-20 MG-MCG tablet; Take 1 tablet by mouth daily.  Dispense: 84 tablet; Refill: 1   Orders Placed This Encounter  Procedures   Ambulatory referral to Gastroenterology    Meds:  Meds ordered this encounter  Medications   norethindrone -ethinyl estradiol -FE (LOESTRIN FE) 1-20 MG-MCG tablet    Sig: Take 1 tablet by mouth daily.    Dispense:  84 tablet    Refill:  1    Follow-up: Return in about 3 months (around 09/14/2024).  Ronal GORMAN Pinal, MD 06/14/2024 2:41 PM

## 2024-06-29 ENCOUNTER — Ambulatory Visit (HOSPITAL_BASED_OUTPATIENT_CLINIC_OR_DEPARTMENT_OTHER): Admitting: Family Medicine

## 2024-07-02 ENCOUNTER — Ambulatory Visit
Admission: RE | Admit: 2024-07-02 | Discharge: 2024-07-02 | Disposition: A | Discharge status: Home / Self Care | Source: Ambulatory Visit | Attending: Family Medicine | Admitting: Family Medicine

## 2024-07-02 ENCOUNTER — Other Ambulatory Visit: Payer: Self-pay

## 2024-07-02 VITALS — BP 115/73 | HR 69 | Temp 98.8°F | Resp 16

## 2024-07-02 DIAGNOSIS — J069 Acute upper respiratory infection, unspecified: Secondary | ICD-10-CM | POA: Diagnosis not present

## 2024-07-02 DIAGNOSIS — R051 Acute cough: Secondary | ICD-10-CM | POA: Diagnosis not present

## 2024-07-02 DIAGNOSIS — W57XXXA Bitten or stung by nonvenomous insect and other nonvenomous arthropods, initial encounter: Secondary | ICD-10-CM | POA: Diagnosis not present

## 2024-07-02 LAB — POC SOFIA SARS ANTIGEN FIA: SARS Coronavirus 2 Ag: NEGATIVE

## 2024-07-02 NOTE — Discharge Instructions (Signed)
 You tested negative for COVID.  Please treat your symptoms with over the counter cough medication, tylenol or ibuprofen, humidifier, and rest. Viral illnesses can last 7-14 days. Please follow up with your PCP if your symptoms are not improving. Please go to the ER for any worsening symptoms. This includes but is not limited to fever you can not control with tylenol or ibuprofen, you are not able to stay hydrated, you have shortness of breath or chest pain.  Thank you for choosing [facility] for your healthcare needs. I hope you feel better soon!

## 2024-07-02 NOTE — ED Triage Notes (Signed)
 Pt states sx started after ceaning under deck on Wednesday. PT c/o productive cough w/yellow mucous, wheezing, fatigue, nauseax4d. Pt states has bug bites all over abdomen and back and took benadryl Thursday and Saturday to see if it's a reaction to bug bites.

## 2024-07-02 NOTE — ED Provider Notes (Signed)
 [facility]    CSN: [id number] Arrival date & time: 07/02/24  1202      History   Chief Complaint Chief Complaint  Patient presents with   Nasal Congestion    Dizziness, cough, earache, shortness of breath - Entered by patient    HPI Tanya Mccoy is a 48 y.o. female  presents for evaluation of URI symptoms for 5 days. Patient reports associated symptoms of cough, congestion, fatigue, nausea, wheezing. Denies fall, diarrhea, fevers, ear pain, shortness of breath. Patient does not have a hx of asthma. Patient is his not an active smoker.   Reports states her son had similar symptoms last week.  Patient she reports she was cleaning out her deck 5 days ago and has several bug bites to her abdomen and back.  Pt has taken Benadryl OTC for symptoms. Pt has no other concerns at this time.   HPI  Past Medical History:  Diagnosis Date   Allergy    Migraine with aura    Muscle strain of left upper arm 06/13/2021    Patient Active Problem List   Diagnosis Date Noted   Vitamin D  insufficiency 10/12/2023   Elevated cholesterol 10/12/2023   Encounter to establish care 04/07/2023   Family history of diabetes mellitus in mother 04/07/2023   General medical exam 04/07/2023   Weight gain 06/13/2021   Seasonal allergies 11/21/2019    Past Surgical History:  Procedure Laterality Date   CESAREAN SECTION      OB History     Gravida  3   Para  2   Term  2   Preterm      AB  1   Living  2      SAB  1   IAB      Ectopic      Multiple      Live Births               Home Medications    Prior to Admission medications   Medication Sig Start Date End Date Taking? Authorizing Provider  BIOTIN PO Take by mouth.    [provider]  cetirizine  (ZYRTEC  ALLERGY) 10 MG tablet Take 1 tablet (10 mg total) by mouth daily. 10/01/23   Christopher Savannah, PA-C  fluticasone  (FLONASE ) 50 MCG/ACT nasal spray Place 2 sprays into both nostrils daily. 12/15/23    Jolinda Norene CHRISTELLA, DO  norethindrone -ethinyl estradiol -FE (LOESTRIN FE) 1-20 MG-MCG tablet Take 1 tablet by mouth daily. 06/14/24   Cleotilde Ronal RAMAN, MD  Probiotic Product (PROBIOTIC DAILY PO) Take by mouth.    [provider]  Semaglutide -Weight Management (WEGOVY ) 0.25 MG/0.5ML SOAJ Inject 0.25 mg into the skin once a week. 02/24/24   Delores Shields A, DO  Vitamin D , Ergocalciferol , (DRISDOL ) 1.25 MG (50000 UNIT) CAPS capsule Take 1 capsule (50,000 Units total) by mouth every 7 (seven) days. 12/06/23   Delores Shields LABOR, DO    Family History Family History  Problem Relation Age of Onset   Diabetes Mother    Healthy Father    Autoimmune disease Sister    Thyroid  disease Sister    Breast cancer Neg Hx     Social History Social History   Tobacco Use   Smoking status: Never   Smokeless tobacco: Never  Vaping Use   Vaping status: Never Used  Substance Use Topics   Alcohol use: Yes    Comment: occasionally   Drug use: Never     Allergies  Patient has no known allergies.   Review of Systems Review of Systems  Constitutional:  Positive for fatigue.  HENT:  Positive for congestion.   Respiratory:  Positive for cough and wheezing.   Skin:        Bug bites     Physical Exam Triage Vital Signs ED Triage Vitals  Encounter Vitals Group     BP 07/02/24 1220 115/73     Girls Systolic BP Percentile --      Girls Diastolic BP Percentile --      Boys Systolic BP Percentile --      Boys Diastolic BP Percentile --      Pulse Rate 07/02/24 1220 69     Resp 07/02/24 1220 16     Temp 07/02/24 1220 98.8 F (37.1 C)     Temp Source 07/02/24 1220 Oral     SpO2 07/02/24 1220 97 %     Weight --      Height --      Head Circumference --      Peak Flow --      Pain Score 07/02/24 1218 6     Pain Loc --      Pain Education --      Exclude from Growth Chart --    No data found.  Updated Vital Signs BP 115/73   Pulse 69   Temp 98.8 F (37.1 C) (Oral)   Resp 16   LMP  06/01/2024   SpO2 97%   Visual Acuity Right Eye Distance:   Left Eye Distance:   Bilateral Distance:    Right Eye Near:   Left Eye Near:    Bilateral Near:     Physical Exam Vitals and nursing note reviewed.  Constitutional:      General: She is not in acute distress.    Appearance: She is well-developed. She is not ill-appearing.  HENT:     Head: Normocephalic and atraumatic.     Right Ear: Tympanic membrane and ear canal normal.     Left Ear: Tympanic membrane and ear canal normal.     Nose: Congestion present.     Mouth/Throat:     Mouth: Mucous membranes are moist.     Pharynx: Oropharynx is clear. Uvula midline. No oropharyngeal exudate or posterior oropharyngeal erythema.     Tonsils: No tonsillar exudate or tonsillar abscesses.  Eyes:     Conjunctiva/sclera: Conjunctivae normal.     Pupils: Pupils are equal, round, and reactive to light.  Cardiovascular:     Rate and Rhythm: Normal rate and regular rhythm.     Heart sounds: Normal heart sounds.  Pulmonary:     Effort: Pulmonary effort is normal.     Breath sounds: Normal breath sounds. No wheezing or rhonchi.  Musculoskeletal:     Cervical back: Normal range of motion and neck supple.  Lymphadenopathy:     Cervical: No cervical adenopathy.  Skin:    General: Skin is warm and dry.     Comments: Scattered bug bites on abdomen and back.  There is no insect bites on extremities face or neck.  There is no erythema warmth or drainage of the bug bite areas.  Neurological:     General: No focal deficit present.     Mental Status: She is alert and oriented to person, place, and time.  Psychiatric:        Mood and Affect: Mood normal.        Behavior: Behavior normal.  UC Treatments / Results  Labs (all labs ordered are listed, but only abnormal results are displayed) Labs Reviewed  POC SOFIA SARS ANTIGEN FIA    EKG   Radiology No results found.  Procedures Procedures (including critical care  time)  Medications Ordered in UC Medications - No data to display  Initial Impression / Assessment and Plan / UC Course  I have reviewed the triage vital signs and the nursing notes.  Pertinent labs & imaging results that were available during my care of the patient were reviewed by me and considered in my medical decision making (see chart for details).     Reviewed exam and symptoms with patient.  No red flags.  Negative COVID testing.  Discussed viral illness and symptomatic treatment.  May continue Benadryl OTC as needed for her bug bites.  No signs of infection on exam.  Advise rest fluids and PCP follow-up as symptoms do not improve.  ER precautions reviewed. Final Clinical Impressions(s) / UC Diagnoses   Final diagnoses:  Acute cough  Insect bite, unspecified site, initial encounter  Viral upper respiratory illness     Discharge Instructions      You tested negative for COVID.  Please treat your symptoms with over the counter cough medication, tylenol or ibuprofen, humidifier, and rest. Viral illnesses can last 7-14 days. Please follow up with your PCP if your symptoms are not improving. Please go to the ER for any worsening symptoms. This includes but is not limited to fever you can not control with tylenol or ibuprofen, you are not able to stay hydrated, you have shortness of breath or chest pain.  Thank you for choosing [facility] for your healthcare needs. I hope you feel better soon!      ED Prescriptions   None    PDMP not reviewed this encounter.   Loreda Myla SAUNDERS, NP 07/02/24 1257

## 2024-08-04 ENCOUNTER — Encounter (HOSPITAL_BASED_OUTPATIENT_CLINIC_OR_DEPARTMENT_OTHER): Payer: Self-pay | Admitting: Family Medicine

## 2024-08-04 ENCOUNTER — Ambulatory Visit (INDEPENDENT_AMBULATORY_CARE_PROVIDER_SITE_OTHER): Admitting: Family Medicine

## 2024-08-04 VITALS — BP 131/76 | HR 75 | Ht 63.0 in | Wt 204.8 lb

## 2024-08-04 DIAGNOSIS — E785 Hyperlipidemia, unspecified: Secondary | ICD-10-CM

## 2024-08-04 DIAGNOSIS — E559 Vitamin D deficiency, unspecified: Secondary | ICD-10-CM

## 2024-08-04 DIAGNOSIS — E66812 Obesity, class 2: Secondary | ICD-10-CM | POA: Diagnosis not present

## 2024-08-04 DIAGNOSIS — J302 Other seasonal allergic rhinitis: Secondary | ICD-10-CM

## 2024-08-04 DIAGNOSIS — Z23 Encounter for immunization: Secondary | ICD-10-CM

## 2024-08-04 DIAGNOSIS — Z0000 Encounter for general adult medical examination without abnormal findings: Secondary | ICD-10-CM

## 2024-08-04 MED ORDER — ZEPBOUND 2.5 MG/0.5ML ~~LOC~~ SOAJ: 2.5000 mg | SUBCUTANEOUS | 1 refills | Status: DC

## 2024-08-04 NOTE — Patient Instructions (Signed)
  Medication Instructions:  Your physician recommends that you continue on your current medications as directed. Please refer to the Current Medication list given to you today. --If you need a refill on any your medications before your next appointment, please call your pharmacy first. If no refills are authorized on file call the office.-- Lab Work: Your physician has recommended that you have lab work today: 1 week before next visit  If you have labs (blood work) drawn today and your tests are completely normal, you will receive your results via MyChart message OR a phone call from our staff.  Please ensure you check your voicemail in the event that you authorized detailed messages to be left on a delegated number. If you have any lab test that is abnormal or we need to change your treatment, we will call you to review the results.   Follow-Up: Your next appointment:   Your physician recommends that you schedule a follow-up appointment in: 2 months physical with Dr. de [place]  You will receive a text message or e-mail with a link to a survey about your care and experience with [place] today! We would greatly appreciate your feedback!   Thanks for letting us be apart of your health journey!!  Primary Care and Sports Medicine   Dr. Ceasar Mons [place]   We encourage you to activate your patient portal called "MyChart".  Sign up information is provided on this After Visit Summary.  MyChart is used to connect with patients for Virtual Visits (Telemedicine).  Patients are able to view lab/test results, encounter notes, upcoming appointments, etc.  Non-urgent messages can be sent to your provider as well. To learn more about what you can do with MyChart, please visit --  [email or web address].

## 2024-08-04 NOTE — Assessment & Plan Note (Addendum)
 Long discussion today reviewing weight loss medications including injectable options including GLP-1 receptor agonist, oral agents.  We discussed potential risk, benefits, cost associated with these various medications as well as the possibility of insurance coverage or lack thereof.  We discussed typical dosing regimen for both injectable and oral medications, proper administration of each.  We discussed potential outcomes with each. After discussion of potential risks and adverse reactions with these medications and potential benefits of each, patient would like to proceed with injectable GLP-1 receptor agonist if possible.  Prescription sent to pharmacy on file, if medication is cost prohibitive for patient, she will let us  know and we can look to send an alternative option, such as Wegovy  if it is covered better by insurance.  Will plan for close follow-up to monitor response to whichever medication patient is able to initiate. She ultimately would be okay with utilizing self-pay option for Zepbound if not covered by insurance Additional consideration is for referral to healthy weight and wellness clinic - she has worked with them previously and did have trouble with scheduling and keeping appointment

## 2024-08-04 NOTE — Assessment & Plan Note (Signed)
 Typically utilizes Flonase  as well as Zyrtec .  Feels that Zyrtec  is not working quite as well.  We discussed options and switching to alternative oral antihistamine such as Allegra or Claritin would be reasonable

## 2024-08-04 NOTE — Progress Notes (Signed)
 New Patient Office Visit  Subjective   Patient ID: Tanya Mccoy, female    DOB: [birth year]  Age: 48 y.o. MRN: [id number]  CC:  Chief Complaint  Patient presents with   New Patient (Initial Visit)    New patient - [facility] Patient obgyn put on birth control stopped taking last week cause this made her gain weight and didn't help cramping patient is always tired and always hurting     HPI Tanya Mccoy presents to establish care Last PCP - [facility], last visit last year  Chronic fatigue and chronic pain. Is working with OBGYN related to menstrual issues.  She notes that she does not always feel rested in the morning, has fatigue during the day, worse beginning after about midday. Thinks that she gets about 7 hours of sleep per night. She will doze off at her desk, driving sometimes, not so much with meetings at work.  She has been working with healthy weight and wellness, was started on Wegovy  recently, missed follow-up appt, did not continue with medication. Still interested in this class of medication. Has questions about GLP-1 RA medications today.  Patient is originally from [place], has lived here since 2008. She works for [employer] She enjoys disc golf, building, outdoor landscaping, hikes, indoor cycling.  Outpatient Encounter Medications as of 08/04/2024  Medication Sig   BIOTIN PO Take by mouth.   cetirizine  (ZYRTEC  ALLERGY) 10 MG tablet Take 1 tablet (10 mg total) by mouth daily.   fluticasone  (FLONASE ) 50 MCG/ACT nasal spray Place 2 sprays into both nostrils daily.   Probiotic Product (PROBIOTIC DAILY PO) Take by mouth.   tirzepatide (ZEPBOUND) 2.5 MG/0.5ML Pen Inject 2.5 mg into the skin once a week.   Vitamin D , Ergocalciferol , (DRISDOL ) 1.25 MG (50000 UNIT) CAPS capsule Take 1 capsule (50,000 Units total) by mouth every 7 (seven) days.   norethindrone -ethinyl estradiol -FE (LOESTRIN FE) 1-20  MG-MCG tablet Take 1 tablet by mouth daily. (Patient not taking: Reported on 08/04/2024)   [DISCONTINUED] Semaglutide -Weight Management (WEGOVY ) 0.25 MG/0.5ML SOAJ Inject 0.25 mg into the skin once a week. (Patient not taking: Reported on 08/04/2024)   No facility-administered encounter medications on file as of 08/04/2024.    Past Medical History:  Diagnosis Date   Allergy    Migraine with aura    Muscle strain of left upper arm 06/13/2021    Past Surgical History:  Procedure Laterality Date   CESAREAN SECTION      Family History  Problem Relation Age of Onset   Diabetes Mother    Healthy Father    Autoimmune disease Sister    Thyroid  disease Sister    Breast cancer Neg Hx     Social History   Socioeconomic History   Marital status: Widowed    Spouse name: Not on file   Number of children: Not on file   Years of education: Not on file   Highest education level: Not on file  Occupational History   Occupation: Runner, broadcasting/film/video    Comment: [employer]  Tobacco Use   Smoking status: Never    Passive exposure: Never   Smokeless tobacco: Never  Vaping Use   Vaping status: Never Used  Substance and Sexual Activity   Alcohol use: Yes    Comment: occasionally   Drug use: Never   Sexual activity: Not Currently    Birth control/protection: None  Other Topics Concern   Not on file  Social  History Narrative   Not on file   Social Drivers of Health   Financial Resource Strain: Not on file  Food Insecurity: Not on file  Transportation Needs: Not on file  Physical Activity: Not on file  Stress: Not on file  Social Connections: Not on file  Intimate Partner Violence: Not on file    Objective   BP 131/76 (BP Location: Right Arm, Patient Position: Sitting, Cuff Size: Normal)   Pulse 75   Ht 5' 3 (1.6 m)   Wt 204 lb 12.8 oz (92.9 kg)   SpO2 97%   BMI 36.28 kg/m   Physical Exam  48 year old female in no acute distress Cardiovascular exam with regular  rate and rhythm Lungs clear to auscultation bilaterally  Assessment & Plan:   Hyperlipidemia, unspecified hyperlipidemia type Assessment & Plan: Noted on prior labs, we will plan to recheck cholesterol panel with labs for upcoming physical   Encounter for immunization -     Flu vaccine trivalent PF, 6mos and older(Flulaval,Afluria,Fluarix,Fluzone)  Vitamin D  insufficiency Assessment & Plan: Has not had recent check, prior labs did show that vitamin D  level slightly low.  She does take vitamin D  supplement, however is inconsistent with this.  Will plan to recheck vitamin D  level with labs before next appointment  Orders: -     VITAMIN D  25 Hydroxy (Vit-D Deficiency, Fractures); Future  Seasonal allergies Assessment & Plan: Typically utilizes Flonase  as well as Zyrtec .  Feels that Zyrtec  is not working quite as well.  We discussed options and switching to alternative oral antihistamine such as Allegra or Claritin would be reasonable   Obesity, Class II, BMI 35-39.9 Assessment & Plan: Long discussion today reviewing weight loss medications including injectable options including GLP-1 receptor agonist, oral agents.  We discussed potential risk, benefits, cost associated with these various medications as well as the possibility of insurance coverage or lack thereof.  We discussed typical dosing regimen for both injectable and oral medications, proper administration of each.  We discussed potential outcomes with each. After discussion of potential risks and adverse reactions with these medications and potential benefits of each, patient would like to proceed with injectable GLP-1 receptor agonist if possible.  Prescription sent to pharmacy on file, if medication is cost prohibitive for patient, she will let us  know and we can look to send an alternative option, such as Wegovy  if it is covered better by insurance.  Will plan for close follow-up to monitor response to whichever medication patient  is able to initiate. She ultimately would be okay with utilizing self-pay option for Zepbound if not covered by insurance Additional consideration is for referral to healthy weight and wellness clinic - she has worked with them previously and did have trouble with scheduling and keeping appointment  Orders: -     Zepbound; Inject 2.5 mg into the skin once a week.  Dispense: 2 mL; Refill: 1  Wellness examination -     CBC with Differential/Platelet; Future -     Comprehensive metabolic panel with GFR; Future -     Hemoglobin A1c; Future -     Lipid panel; Future -     TSH Rfx on Abnormal to Free T4; Future -     VITAMIN D  25 Hydroxy (Vit-D Deficiency, Fractures); Future  Return in about 2 months (around 10/04/2024) for CPE with fasting labs 1 week prior.   Spent 46 minutes on this patient encounter, including preparation, chart review, face-to-face counseling with patient and coordination of  care, and documentation of encounter   ___________________________________________ Kloee Ballew de [place], MD, ABFM, [facility]

## 2024-08-04 NOTE — Assessment & Plan Note (Signed)
 Has not had recent check, prior labs did show that vitamin D  level slightly low.  She does take vitamin D  supplement, however is inconsistent with this.  Will plan to recheck vitamin D  level with labs before next appointment

## 2024-08-04 NOTE — Assessment & Plan Note (Signed)
 Noted on prior labs, we will plan to recheck cholesterol panel with labs for upcoming physical

## 2024-08-07 ENCOUNTER — Telehealth (HOSPITAL_BASED_OUTPATIENT_CLINIC_OR_DEPARTMENT_OTHER): Payer: Self-pay

## 2024-08-07 NOTE — Telephone Encounter (Signed)
 Patient called in today wanting to know if you would order the ultrasound that was discussed at her last visit. She states that she was put on a low dose progesterone  to try. If the medication did not work, then [employer] would do a pelvic exam and/or do an ultrasound. Patient states she stop taking the medication 2 weeks ago because of weight gain. Then she started bleeding. Please advise. tbw

## 2024-08-08 ENCOUNTER — Other Ambulatory Visit (HOSPITAL_BASED_OUTPATIENT_CLINIC_OR_DEPARTMENT_OTHER): Payer: Self-pay | Admitting: Obstetrics & Gynecology

## 2024-08-08 DIAGNOSIS — N946 Dysmenorrhea, unspecified: Secondary | ICD-10-CM

## 2024-08-10 ENCOUNTER — Telehealth (HOSPITAL_BASED_OUTPATIENT_CLINIC_OR_DEPARTMENT_OTHER): Payer: Self-pay | Admitting: *Deleted

## 2024-08-10 NOTE — Telephone Encounter (Signed)
 Copied from CRM [id number]. Topic: Clinical - Medication Question >> Aug 10, 2024  8:27 AM Tanya Mccoy wrote: Reason for CRM: pt calling for an update on sending a PA for tirzepatide (ZEPBOUND) 2.5 MG/0.5ML Pen. Pt stated she called yesterday 10/15

## 2024-08-10 NOTE — Telephone Encounter (Signed)
 I have not received a PA for this please have the patient reach out to the pharmacy and have them initiate the prior authorization and I will get this completed

## 2024-08-11 ENCOUNTER — Telehealth: Payer: Self-pay

## 2024-08-11 ENCOUNTER — Other Ambulatory Visit (HOSPITAL_BASED_OUTPATIENT_CLINIC_OR_DEPARTMENT_OTHER): Payer: Self-pay | Admitting: Family Medicine

## 2024-08-11 DIAGNOSIS — E66812 Obesity, class 2: Secondary | ICD-10-CM

## 2024-08-11 NOTE — Telephone Encounter (Signed)
 This RN initiated new refill request and attached appropriate pharmacy.   Copied from CRM #[id number]. Topic: Clinical - Prescription Issue >> Aug 11, 2024  2:27 PM Hadassah PARAS wrote: Reason for CRM: Pt is wanting to change pharmacy for medication tirzepatide (ZEPBOUND) 2.5 MG/0.5ML Pen.   [facility] [phone] [address] [place] [address]

## 2024-08-11 NOTE — Telephone Encounter (Signed)
Please see additional telephone encounter.

## 2024-08-14 ENCOUNTER — Other Ambulatory Visit (HOSPITAL_BASED_OUTPATIENT_CLINIC_OR_DEPARTMENT_OTHER): Payer: Self-pay

## 2024-08-14 MED ORDER — ZEPBOUND 2.5 MG/0.5ML ~~LOC~~ SOAJ
2.5000 mg | SUBCUTANEOUS | 1 refills | Status: DC
  Filled 2024-08-14 – 2024-08-16 (×2): qty 2, 28d supply, fill #0

## 2024-08-14 NOTE — Telephone Encounter (Signed)
 Rx for pt's zepbound has been sent to [facility] for pt.

## 2024-08-16 ENCOUNTER — Other Ambulatory Visit (HOSPITAL_BASED_OUTPATIENT_CLINIC_OR_DEPARTMENT_OTHER): Payer: Self-pay | Admitting: Family Medicine

## 2024-08-16 ENCOUNTER — Other Ambulatory Visit (HOSPITAL_BASED_OUTPATIENT_CLINIC_OR_DEPARTMENT_OTHER): Payer: Self-pay

## 2024-08-16 DIAGNOSIS — E66812 Obesity, class 2: Secondary | ICD-10-CM

## 2024-08-16 MED ORDER — ZEPBOUND 2.5 MG/0.5ML ~~LOC~~ SOAJ
2.5000 mg | SUBCUTANEOUS | 1 refills | Status: DC
  Filled 2024-08-16 – 2024-08-21 (×2): qty 2, 28d supply, fill #0

## 2024-08-16 NOTE — Telephone Encounter (Signed)
 Tried to initiate a prior authorization for pt's zepound. Received below response:  Information regarding your request This request cannot be processed due to the member's coverage has terminated. Resubmit using active member ID information.    Sent mychart message to pt about this for her to contact pharmacy to make sure they have correct insurance information and let her know that the pharmacy is going to need to resubmit the PA request to see if we are able to do it or if we receive the same response.

## 2024-08-16 NOTE — Telephone Encounter (Unsigned)
 Copied from CRM #[id number]. Topic: Clinical - Medication Prior Auth >> Aug 16, 2024  9:00 AM Mia F wrote: Reason for CRM: Pt called in to state the pharmacy says she need a prior authorization for the tirzepatide (ZEPBOUND) 2.5 MG/0.5ML Pen. PT says if the insurance does not cover the medication she would like to get it by mail. She does not know what pharmacy she would like for it to be sent to but she says that the dr told her if the insurance does not cover meds she can get it by mail

## 2024-08-17 ENCOUNTER — Encounter (HOSPITAL_BASED_OUTPATIENT_CLINIC_OR_DEPARTMENT_OTHER): Payer: Self-pay | Admitting: Obstetrics & Gynecology

## 2024-08-17 ENCOUNTER — Other Ambulatory Visit (HOSPITAL_BASED_OUTPATIENT_CLINIC_OR_DEPARTMENT_OTHER): Payer: Self-pay

## 2024-08-17 ENCOUNTER — Encounter (HOSPITAL_BASED_OUTPATIENT_CLINIC_OR_DEPARTMENT_OTHER): Payer: Self-pay

## 2024-08-17 ENCOUNTER — Ambulatory Visit (HOSPITAL_BASED_OUTPATIENT_CLINIC_OR_DEPARTMENT_OTHER): Admitting: Obstetrics & Gynecology

## 2024-08-17 VITALS — BP 133/73 | HR 85 | Wt 206.0 lb

## 2024-08-17 DIAGNOSIS — N946 Dysmenorrhea, unspecified: Secondary | ICD-10-CM

## 2024-08-17 NOTE — Progress Notes (Signed)
 Appt cancelled and rescheduled

## 2024-08-21 ENCOUNTER — Telehealth (HOSPITAL_BASED_OUTPATIENT_CLINIC_OR_DEPARTMENT_OTHER): Payer: Self-pay | Admitting: *Deleted

## 2024-08-21 ENCOUNTER — Other Ambulatory Visit (HOSPITAL_BASED_OUTPATIENT_CLINIC_OR_DEPARTMENT_OTHER): Payer: Self-pay

## 2024-08-21 NOTE — Telephone Encounter (Signed)
Please see new mychart messages sent by pt. 

## 2024-08-21 NOTE — Telephone Encounter (Signed)
 Copied from CRM [id number]. Topic: Clinical - Prescription Issue >> Aug 21, 2024 11:47 AM Tanya Mccoy wrote: Reason for CRM: The patient has called to share that they have been in contact with their insurance company and been told that their prescription for Rx #: [id number] tirzepatide (ZEPBOUND) 2.5 MG/0.5ML Pen [[id number]] would not be covered. The patient would like to be contacted to discuss cost saving options if/when possible

## 2024-08-21 NOTE — Telephone Encounter (Signed)
 Mychart also sent which has been sent to PCP.

## 2024-08-23 ENCOUNTER — Telehealth (HOSPITAL_BASED_OUTPATIENT_CLINIC_OR_DEPARTMENT_OTHER): Payer: Self-pay | Admitting: Family Medicine

## 2024-08-23 ENCOUNTER — Other Ambulatory Visit: Payer: Self-pay

## 2024-08-23 ENCOUNTER — Ambulatory Visit (HOSPITAL_BASED_OUTPATIENT_CLINIC_OR_DEPARTMENT_OTHER): Admitting: Obstetrics & Gynecology

## 2024-08-23 ENCOUNTER — Encounter (HOSPITAL_BASED_OUTPATIENT_CLINIC_OR_DEPARTMENT_OTHER): Payer: Self-pay | Admitting: Obstetrics & Gynecology

## 2024-08-23 ENCOUNTER — Other Ambulatory Visit (HOSPITAL_BASED_OUTPATIENT_CLINIC_OR_DEPARTMENT_OTHER)

## 2024-08-23 ENCOUNTER — Ambulatory Visit (INDEPENDENT_AMBULATORY_CARE_PROVIDER_SITE_OTHER): Admitting: Obstetrics & Gynecology

## 2024-08-23 VITALS — BP 126/74 | HR 81 | Ht 63.0 in | Wt 207.0 lb

## 2024-08-23 DIAGNOSIS — N920 Excessive and frequent menstruation with regular cycle: Secondary | ICD-10-CM

## 2024-08-23 DIAGNOSIS — G8929 Other chronic pain: Secondary | ICD-10-CM

## 2024-08-23 DIAGNOSIS — N946 Dysmenorrhea, unspecified: Secondary | ICD-10-CM

## 2024-08-23 DIAGNOSIS — E66812 Obesity, class 2: Secondary | ICD-10-CM

## 2024-08-23 DIAGNOSIS — E785 Hyperlipidemia, unspecified: Secondary | ICD-10-CM

## 2024-08-23 NOTE — Telephone Encounter (Signed)
 Routing encounter to Dr. De Cuba as RICK.

## 2024-08-23 NOTE — Telephone Encounter (Signed)
 Patient came by to let us  know her insurance will cover Wegovy  and would like that called in to the pharmacy downstairs thank you please advise

## 2024-08-23 NOTE — Progress Notes (Unsigned)
   Ultrasound f/u Patient name: Tanya Mccoy MRN [id number]  Date of birth: 1976/04/02 Chief Complaint:   Follow-up  History of Present Illness:   Tanya Mccoy is a 48 y.o. [phone] female being seen today for discussion of ultrasound findings and to discuss medication treatment for bleeding and cramping.  She has been on progesterone  without a lot of improvement.  She did take OCPs as well for two months.  Cramping really didn't change so she stopped as this is her biggest complaint.  Also, she's not sure if this is related but does feel like it is making her symptoms worse--she is having low back pain that wraps around to there front, more on the left.  She has a very physically active job.  No specific injury.    Ultrasound findings discussed.  She does have myometrial streaking and this is consistent with adenomyosis.  Uterine size normal.  Endometrium trilaminar.  Ovaries normal.  Adeonmyosis discussed including symptoms, treatments, and causes.      Patient's last menstrual period was 08/15/2024 (exact date).   Last pap 12/17/2022. Results were: neg with neg HR HPV. H/O abnormal pap: no  Review of Systems:   Pertinent items are noted in HPI Pertinent History Reviewed:  Reviewed past medical,surgical, social and family history.  Reviewed problem list, medications and allergies. Physical Assessment:   Vitals:   08/23/24 1300  BP: 126/74  Pulse: 81  SpO2: 98%  Weight: 207 lb (93.9 kg)  Height: 5' 3 (1.6 m)  Body mass index is 36.67 kg/m.        Physical Examination:   General appearance - well appearing, and in no distress  Mental status - alert, oriented to person, place, and time  Psych:  She has a normal mood and affect    Assessment & Plan:  1. Dysmenorrhea (Primary) - discussed ultrasound findings and treatment options.  She has already been on oral progesterone  and OCPs.  Not really interested in additional treatment  with hormonal therapy.   IUD and hysterectomy discussed.  Given the other low back pain issues, feel evaluation by sports medicine is reasonable.  She is comfortable with this as well.  Is leaning towards surgery but wouldn't do this until 2026.   - Reminder placed to reach out to pt in early 2026.  2. Menorrhagia with regular cycle - does need endometrial biopsy.  She will return for this.    3. Chronic left-sided low back pain without sciatica - AMB referral to sports medicine - will await results from this and then can make additional plans.    Orders Placed This Encounter  Procedures   AMB referral to sports medicine    Meds: No orders of the defined types were placed in this encounter.   Total time with pt with discussion and documentation:  23 minutes  Ronal GORMAN Pinal, MD 08/24/2024 6:57 AM GYNECOLOGY  VISIT

## 2024-08-24 ENCOUNTER — Ambulatory Visit (HOSPITAL_BASED_OUTPATIENT_CLINIC_OR_DEPARTMENT_OTHER): Payer: Self-pay | Admitting: Obstetrics & Gynecology

## 2024-08-24 DIAGNOSIS — N920 Excessive and frequent menstruation with regular cycle: Secondary | ICD-10-CM | POA: Insufficient documentation

## 2024-08-25 ENCOUNTER — Other Ambulatory Visit (HOSPITAL_BASED_OUTPATIENT_CLINIC_OR_DEPARTMENT_OTHER): Payer: Self-pay

## 2024-08-25 MED ORDER — WEGOVY 0.25 MG/0.5ML ~~LOC~~ SOAJ
0.2500 mg | SUBCUTANEOUS | 1 refills | Status: DC
Start: 2024-08-25 — End: 2024-09-12
  Filled 2024-08-25: qty 2, 28d supply, fill #0

## 2024-08-31 ENCOUNTER — Encounter: Payer: Self-pay | Admitting: Internal Medicine

## 2024-09-11 ENCOUNTER — Encounter (HOSPITAL_BASED_OUTPATIENT_CLINIC_OR_DEPARTMENT_OTHER): Payer: Self-pay | Admitting: Family Medicine

## 2024-09-11 DIAGNOSIS — E785 Hyperlipidemia, unspecified: Secondary | ICD-10-CM

## 2024-09-11 DIAGNOSIS — E66812 Obesity, class 2: Secondary | ICD-10-CM

## 2024-09-11 NOTE — Progress Notes (Unsigned)
 [facility] [address] [place] [address] Phone: [phone] Subjective:   ISusannah Mccoy, am serving as a scribe for Dr. Arthea Claudene.  I'm seeing this patient by the request  of:  de Cuba, Quintin PARAS, MD  CC: Left-sided low back pain  YEP:Tanya Mccoy  [name] is a 48 y.o. female coming in with complaint of L sided lower back pain. Patient states pain gets worse around menstrual cycle. Used to go to [facility] but hasn't been in a while. Pain over B SI joints but worse of left constant dull pain. Sharp pain during cycle. Occasionally takes ibuprofen.      Past Medical History:  Diagnosis Date   Allergy    Migraine with aura    Muscle strain of left upper arm 06/13/2021   Past Surgical History:  Procedure Laterality Date   CESAREAN SECTION     Social History   Socioeconomic History   Marital status: Widowed    Spouse name: Not on file   Number of children: Not on file   Years of education: Not on file   Highest education level: Not on file  Occupational History   Occupation: Runner, Broadcasting/film/video    Comment: [employer]  Tobacco Use   Smoking status: Never    Passive exposure: Never   Smokeless tobacco: Never  Vaping Use   Vaping status: Never Used  Substance and Sexual Activity   Alcohol use: Yes    Alcohol/week: 1.0 standard drink of alcohol    Types: 1 Glasses of wine per week   Drug use: Never   Sexual activity: Not Currently    Birth control/protection: None  Other Topics Concern   Not on file  Social History Narrative   Not on file   Social Drivers of Health   Financial Resource Strain: Not on file  Food Insecurity: Not on file  Transportation Needs: Not on file  Physical Activity: Not on file  Stress: Not on file  Social Connections: Not on file   No Known Allergies Family History  Problem Relation Age of Onset   Diabetes Mother    Healthy Father    Autoimmune disease Sister     Thyroid  disease Sister    Breast cancer Neg Hx       Current Outpatient Medications (Respiratory):    cetirizine  (ZYRTEC  ALLERGY) 10 MG tablet, Take 1 tablet (10 mg total) by mouth daily.   fluticasone  (FLONASE ) 50 MCG/ACT nasal spray, Place 2 sprays into both nostrils daily.    Current Outpatient Medications (Other):    BIOTIN PO, Take by mouth.   Probiotic Product (PROBIOTIC DAILY PO), Take by mouth.   semaglutide -weight management (WEGOVY ) 0.5 MG/0.5ML SOAJ SQ injection, Inject 0.5 mg into the skin once a week.   Reviewed prior external information including notes and imaging from  primary care provider As well as notes that were available from care everywhere and other healthcare systems.  Past medical history, social, surgical and family history all reviewed in electronic medical record.  No pertanent information unless stated regarding to the chief complaint.   Review of Systems:  No headache, visual changes, nausea, vomiting, diarrhea, constipation, dizziness, abdominal pain, skin rash, fevers, chills, night sweats, weight loss, swollen lymph nodes, body aches, joint swelling, chest pain, shortness of breath, mood changes. POSITIVE muscle aches  Objective  Blood pressure 124/84, pulse 86, height 5' 3 (1.6 m), weight 207 lb (93.9 kg), last menstrual period 08/15/2024, SpO2 96%.  General: No apparent distress alert and oriented x3 mood and affect normal, dressed appropriately.  HEENT: Pupils equal, extraocular movements intact  Respiratory: Patient's speak in full sentences and does not appear short of breath  Cardiovascular: No lower extremity edema, non tender, no erythema  Patient does have some tenderness over the sacroiliac joints bilaterally.  Positive FABER test right greater than left.  Osteopathic findings  C6 flexed rotated and side bent left T6 extended rotated and side bent right inhaled third rib L5 flexed rotated and side bent left Sacrum right on  right   [id number]; 15 additional minutes spent for Therapeutic exercises as stated in above notes.  This included exercises focusing on stretching, strengthening, with significant focus on eccentric aspects.   Long term goals include an improvement in range of motion, strength, endurance as well as avoiding reinjury. Patient's frequency would include in 1-2 times a day, 3-5 times a week for a duration of 6-12 weeks. Low back exercises that included:  Pelvic tilt/bracing instruction to focus on control of the pelvic girdle and lower abdominal muscles  Glute strengthening exercises, focusing on proper firing of the glutes without engaging the low back muscles Proper stretching techniques for maximum relief for the hamstrings, hip flexors, low back and some rotation where tolerated  Proper technique shown and discussed handout in great detail with ATC.  All questions were discussed and answered.      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  Discussed icing regimen and home exercises, responded extremely well to osteopathic manipulation.  Discussed in certain increasing activity.  Discussed following up with me again in 6 to 8 weeks.  Responded well to osteopathic manipulation.    Decision today to treat with OMT was based on Physical Exam  After verbal consent patient was treated with HVLA, ME, FPR techniques in cervical, thoracic, rib, lumbar and sacral areas, all areas are chronic   Patient tolerated the procedure well with improvement in symptoms  Patient given exercises, stretches and lifestyle modifications  See medications in patient instructions if given  Patient will follow up in 4-8 weeks The above documentation has been reviewed and is accurate and complete Sennie Borden M Alezandra Egli, DO

## 2024-09-11 NOTE — Telephone Encounter (Signed)
 Please see mychart message sent by pt and advise.

## 2024-09-12 ENCOUNTER — Other Ambulatory Visit (HOSPITAL_BASED_OUTPATIENT_CLINIC_OR_DEPARTMENT_OTHER): Payer: Self-pay

## 2024-09-12 ENCOUNTER — Other Ambulatory Visit (HOSPITAL_COMMUNITY): Payer: Self-pay

## 2024-09-12 MED ORDER — WEGOVY 0.5 MG/0.5ML ~~LOC~~ SOAJ
0.5000 mg | SUBCUTANEOUS | 1 refills | Status: DC
  Filled 2024-09-12 – 2024-09-20 (×2): qty 2, 28d supply, fill #0

## 2024-09-14 ENCOUNTER — Other Ambulatory Visit (HOSPITAL_BASED_OUTPATIENT_CLINIC_OR_DEPARTMENT_OTHER): Payer: Self-pay

## 2024-09-14 ENCOUNTER — Ambulatory Visit

## 2024-09-14 ENCOUNTER — Ambulatory Visit (INDEPENDENT_AMBULATORY_CARE_PROVIDER_SITE_OTHER): Admitting: Family Medicine

## 2024-09-14 ENCOUNTER — Encounter: Payer: Self-pay | Admitting: Family Medicine

## 2024-09-14 ENCOUNTER — Other Ambulatory Visit: Payer: Self-pay | Admitting: Family Medicine

## 2024-09-14 VITALS — BP 124/84 | HR 86 | Ht 63.0 in | Wt 207.0 lb

## 2024-09-14 DIAGNOSIS — M9908 Segmental and somatic dysfunction of rib cage: Secondary | ICD-10-CM

## 2024-09-14 DIAGNOSIS — M9901 Segmental and somatic dysfunction of cervical region: Secondary | ICD-10-CM

## 2024-09-14 DIAGNOSIS — M9904 Segmental and somatic dysfunction of sacral region: Secondary | ICD-10-CM

## 2024-09-14 DIAGNOSIS — M533 Sacrococcygeal disorders, not elsewhere classified: Secondary | ICD-10-CM

## 2024-09-14 DIAGNOSIS — G8929 Other chronic pain: Secondary | ICD-10-CM

## 2024-09-14 DIAGNOSIS — M9903 Segmental and somatic dysfunction of lumbar region: Secondary | ICD-10-CM

## 2024-09-14 DIAGNOSIS — M9902 Segmental and somatic dysfunction of thoracic region: Secondary | ICD-10-CM

## 2024-09-14 NOTE — Patient Instructions (Addendum)
 Xray today Good to see you! Have primary draw iron and ferritin levels in addition to regular labs Ferrous gluconate (iron) 325 mg daily with 500mg  of vitamin C  See you again in 8 weeks

## 2024-09-14 NOTE — Assessment & Plan Note (Signed)
 Sacroiliac dysfunction with some muscle imbalances secondary to's of weakness of the hip abductors and tightness of hip flexors.  Discussed icing regimen and home exercises, responded extremely well to osteopathic manipulation.  Discussed in certain increasing activity.  Discussed following up with me again in 6 to 8 weeks.  Responded well to osteopathic manipulation.

## 2024-09-15 ENCOUNTER — Ambulatory Visit (HOSPITAL_BASED_OUTPATIENT_CLINIC_OR_DEPARTMENT_OTHER): Admitting: Obstetrics & Gynecology

## 2024-09-19 ENCOUNTER — Other Ambulatory Visit (HOSPITAL_BASED_OUTPATIENT_CLINIC_OR_DEPARTMENT_OTHER): Payer: Self-pay

## 2024-09-20 ENCOUNTER — Other Ambulatory Visit (HOSPITAL_BASED_OUTPATIENT_CLINIC_OR_DEPARTMENT_OTHER): Payer: Self-pay

## 2024-09-21 ENCOUNTER — Ambulatory Visit: Payer: Self-pay | Admitting: Family Medicine

## 2024-10-02 ENCOUNTER — Other Ambulatory Visit (HOSPITAL_BASED_OUTPATIENT_CLINIC_OR_DEPARTMENT_OTHER): Payer: Self-pay | Admitting: Family Medicine

## 2024-10-02 DIAGNOSIS — E559 Vitamin D deficiency, unspecified: Secondary | ICD-10-CM

## 2024-10-02 DIAGNOSIS — Z0000 Encounter for general adult medical examination without abnormal findings: Secondary | ICD-10-CM

## 2024-10-03 LAB — CBC WITH DIFFERENTIAL/PLATELET
Basophils Absolute: 0 x10E3/uL (ref 0.0–0.2)
Basos: 1 %
EOS (ABSOLUTE): 0.3 x10E3/uL (ref 0.0–0.4)
Eos: 5 %
Hematocrit: 42.2 % (ref 34.0–46.6)
Hemoglobin: 13.3 g/dL (ref 11.1–15.9)
Immature Grans (Abs): 0 x10E3/uL (ref 0.0–0.1)
Immature Granulocytes: 0 %
Lymphocytes Absolute: 1.4 x10E3/uL (ref 0.7–3.1)
Lymphs: 28 %
MCH: 27.9 pg (ref 26.6–33.0)
MCHC: 31.5 g/dL (ref 31.5–35.7)
MCV: 89 fL (ref 79–97)
Monocytes Absolute: 0.4 x10E3/uL (ref 0.1–0.9)
Monocytes: 8 %
Neutrophils Absolute: 2.9 x10E3/uL (ref 1.4–7.0)
Neutrophils: 58 %
Platelets: 269 x10E3/uL (ref 150–450)
RBC: 4.77 x10E6/uL (ref 3.77–5.28)
RDW: 12.7 % (ref 11.7–15.4)
WBC: 5 x10E3/uL (ref 3.4–10.8)

## 2024-10-03 LAB — COMPREHENSIVE METABOLIC PANEL WITH GFR
ALT: 20 IU/L (ref 0–32)
AST: 19 IU/L (ref 0–40)
Albumin: 4.3 g/dL (ref 3.9–4.9)
Alkaline Phosphatase: 75 IU/L (ref 41–116)
BUN/Creatinine Ratio: 15 (ref 9–23)
BUN: 11 mg/dL (ref 6–24)
Bilirubin Total: 0.3 mg/dL (ref 0.0–1.2)
CO2: 21 mmol/L (ref 20–29)
Calcium: 9.3 mg/dL (ref 8.7–10.2)
Chloride: 104 mmol/L (ref 96–106)
Creatinine, Ser: 0.74 mg/dL (ref 0.57–1.00)
Globulin, Total: 2.6 g/dL (ref 1.5–4.5)
Glucose: 77 mg/dL (ref 70–99)
Potassium: 5 mmol/L (ref 3.5–5.2)
Sodium: 138 mmol/L (ref 134–144)
Total Protein: 6.9 g/dL (ref 6.0–8.5)
eGFR: 100 mL/min/1.73 (ref >59)

## 2024-10-03 LAB — LIPID PANEL
Chol/HDL Ratio: 2.9 ratio (ref 0.0–4.4)
Cholesterol, Total: 166 mg/dL (ref 100–199)
HDL: 58 mg/dL (ref >39)
LDL Chol Calc (NIH): 97 mg/dL (ref 0–99)
Triglycerides: 55 mg/dL (ref 0–149)
VLDL Cholesterol Cal: 11 mg/dL (ref 5–40)

## 2024-10-03 LAB — HEMOGLOBIN A1C
Est. average glucose Bld gHb Est-mCnc: 108 mg/dL
Hgb A1c MFr Bld: 5.4 % (ref 4.8–5.6)

## 2024-10-03 LAB — TSH RFX ON ABNORMAL TO FREE T4: TSH: 1.42 u[IU]/mL (ref 0.450–4.500)

## 2024-10-03 LAB — VITAMIN D 25 HYDROXY (VIT D DEFICIENCY, FRACTURES): Vit D, 25-Hydroxy: 22.9 ng/mL — ABNORMAL LOW (ref 30.0–100.0)

## 2024-10-04 ENCOUNTER — Ambulatory Visit (HOSPITAL_BASED_OUTPATIENT_CLINIC_OR_DEPARTMENT_OTHER): Payer: Self-pay | Admitting: Family Medicine

## 2024-10-11 ENCOUNTER — Other Ambulatory Visit (HOSPITAL_BASED_OUTPATIENT_CLINIC_OR_DEPARTMENT_OTHER): Payer: Self-pay

## 2024-10-11 ENCOUNTER — Ambulatory Visit
Admission: RE | Admit: 2024-10-11 | Discharge: 2024-10-11 | Disposition: A | Discharge status: Home / Self Care | Attending: Family Medicine | Admitting: Family Medicine

## 2024-10-11 VITALS — BP 115/79 | HR 88 | Temp 97.9°F | Resp 17

## 2024-10-11 DIAGNOSIS — T7840XA Allergy, unspecified, initial encounter: Secondary | ICD-10-CM

## 2024-10-11 DIAGNOSIS — L509 Urticaria, unspecified: Secondary | ICD-10-CM | POA: Diagnosis not present

## 2024-10-11 MED FILL — Methylprednisolone Tab Therapy Pack 4 MG (21): ORAL | 6 days supply | Qty: 21 | Fill #0 | Status: AC

## 2024-10-11 NOTE — Discharge Instructions (Addendum)
 May continue benadryl at night Double the daily cetirizine  Cool compresses for the swelling Take the steroid pak as directed.  Take all of day one today

## 2024-10-11 NOTE — ED Triage Notes (Signed)
 Pt c/o rash to face that started yesterday. Some spots have spread to arms and chest. Denies use of any new soaps, lotions or detergents. Benedryl last night.

## 2024-10-11 NOTE — ED Provider Notes (Signed)
 Tanya Mccoy    CSN: [id number] Arrival date & time: 10/11/24  9182      History   Chief Complaint Chief Complaint  Patient presents with   Rash    APPT 830AM    HPI Tanya Mccoy is a 48 y.o. female.   HPI  States developed a rash yesterday on her face.  It was very itchy.  She took Benadryl last night.  This morning it is worse.  She has swelling of her eyelids, erythema on her face, rash on her anterior chest and on her upper arms.  No shortness of breath. Patient denies any new medication, supplements, food, or hygiene products.  Has never had a reaction like this before  Past Medical History:  Diagnosis Date   Allergy    Migraine with aura    Muscle strain of left upper arm 06/13/2021    Patient Active Problem List   Diagnosis Date Noted   SI (sacroiliac) joint dysfunction 09/14/2024   Menorrhagia with regular cycle 08/24/2024   Obesity, Class II, BMI 35-39.9 08/04/2024   Vitamin D  insufficiency 10/12/2023   Hyperlipidemia 10/12/2023   Encounter to establish Mccoy 04/07/2023   Family history of diabetes mellitus in mother 04/07/2023   General medical exam 04/07/2023   Dysmenorrhea 04/18/2022   Weight gain 06/13/2021   Seasonal allergies 11/21/2019    Past Surgical History:  Procedure Laterality Date   CESAREAN SECTION      OB History     Gravida  3   Para  2   Term  2   Preterm      AB  1   Living  2      SAB  1   IAB      Ectopic      Multiple      Live Births               Home Medications    Prior to Admission medications  Medication Sig Start Date End Date Taking? Authorizing Provider  methylPREDNISolone  (MEDROL  DOSEPAK) 4 MG TBPK tablet Take as directed 10/11/24  Yes Maranda Jamee Jacob, MD  BIOTIN PO Take by mouth.    [provider]  cetirizine  (ZYRTEC  ALLERGY) 10 MG tablet Take 1 tablet (10 mg total) by mouth daily. 10/01/23   Christopher Savannah, PA-C  fluticasone  (FLONASE ) 50 MCG/ACT nasal  spray Place 2 sprays into both nostrils daily. 12/15/23   Jolinda Norene CHRISTELLA, DO  Probiotic Product (PROBIOTIC DAILY PO) Take by mouth.    [provider]  semaglutide -weight management (WEGOVY ) 0.5 MG/0.5ML SOAJ SQ injection Inject 0.5 mg into the skin once a week. 09/12/24   de [place], Raymond J, MD    Family History Family History  Problem Relation Age of Onset   Diabetes Mother    Healthy Father    Autoimmune disease Sister    Thyroid  disease Sister    Breast cancer Neg Hx     Social History Social History[1]   Allergies   Patient has no known allergies.   Review of Systems Review of Systems See HPI  Physical Exam Triage Vital Signs ED Triage Vitals  Encounter Vitals Group     BP 10/11/24 0825 115/79     Girls Systolic BP Percentile --      Girls Diastolic BP Percentile --      Boys Systolic BP Percentile --      Boys Diastolic BP Percentile --      Pulse Rate  10/11/24 0825 88     Resp 10/11/24 0825 17     Temp 10/11/24 0825 97.9 F (36.6 C)     Temp Source 10/11/24 0825 Oral     SpO2 10/11/24 0825 94 %     Weight --      Height --      Head Circumference --      Peak Flow --      Pain Score 10/11/24 0827 7     Pain Loc --      Pain Education --      Exclude from Growth Chart --    No data found.  Updated Vital Signs BP 115/79 (BP Location: Right Arm)   Pulse 88   Temp 97.9 F (36.6 C) (Oral)   Resp 17   LMP 08/11/2024 (Exact Date)   SpO2 94%    Physical Exam Constitutional:      General: She is not in acute distress.    Appearance: She is well-developed.  HENT:     Head: Normocephalic and atraumatic.  Eyes:     Conjunctiva/sclera: Conjunctivae normal.     Pupils: Pupils are equal, round, and reactive to light.  Cardiovascular:     Rate and Rhythm: Normal rate.  Pulmonary:     Effort: Pulmonary effort is normal. No respiratory distress.     Breath sounds: Normal breath sounds.  Musculoskeletal:        General: Normal range of  motion.     Cervical back: Normal range of motion.  Skin:    General: Skin is warm and dry.     Findings: Rash present.     Comments: Scattered urticarial wheals cover most of face with mild eyelid swelling left greater than right.  Oropharynx benign.  Lesions also on anterior chest and left bicep region.  Neurological:     Mental Status: She is alert.      UC Treatments / Results  Labs (all labs ordered are listed, but only abnormal results are displayed) Labs Reviewed - No data to display  EKG   Radiology No results found.  Procedures Procedures (including critical Mccoy time)  Medications Ordered in UC Medications - No data to display  Initial Impression / Assessment and Plan / UC Course  I have reviewed the triage vital signs and the nursing notes.  Pertinent labs & imaging results that were available during my Mccoy of the patient were reviewed by me and considered in my medical decision making (see chart for details).     Final Clinical Impressions(s) / UC Diagnoses   Final diagnoses:  Urticaria  Allergic reaction, initial encounter     Discharge Instructions      May continue benadryl at night Double the daily cetirizine  Cool compresses for the swelling Take the steroid pak as directed.  Take all of day one today   ED Prescriptions     Medication Sig Dispense Auth. Provider   methylPREDNISolone  (MEDROL  DOSEPAK) 4 MG TBPK tablet Take as directed 21 tablet Maranda Jamee Jacob, MD      PDMP not reviewed this encounter.    [1]  Social History Tobacco Use   Smoking status: Never    Passive exposure: Never   Smokeless tobacco: Never  Vaping Use   Vaping status: Never Used  Substance Use Topics   Alcohol use: Yes    Alcohol/week: 1.0 standard drink of alcohol    Types: 1 Glasses of wine per week   Drug use: Never  Maranda Jamee Jacob, MD 10/11/24 [phone]

## 2024-10-12 ENCOUNTER — Encounter (HOSPITAL_BASED_OUTPATIENT_CLINIC_OR_DEPARTMENT_OTHER): Admitting: Family Medicine

## 2024-10-12 ENCOUNTER — Other Ambulatory Visit (HOSPITAL_BASED_OUTPATIENT_CLINIC_OR_DEPARTMENT_OTHER): Payer: Self-pay

## 2024-10-12 VITALS — BP 116/62 | HR 77 | Temp 97.5°F | Resp 18 | Ht 63.0 in | Wt 200.0 lb

## 2024-10-12 DIAGNOSIS — E66812 Obesity, class 2: Secondary | ICD-10-CM | POA: Diagnosis not present

## 2024-10-12 DIAGNOSIS — E559 Vitamin D deficiency, unspecified: Secondary | ICD-10-CM

## 2024-10-12 DIAGNOSIS — Z0000 Encounter for general adult medical examination without abnormal findings: Secondary | ICD-10-CM | POA: Diagnosis not present

## 2024-10-12 MED ORDER — WEGOVY 1 MG/0.5ML ~~LOC~~ SOAJ
1.0000 mg | SUBCUTANEOUS | 2 refills | Status: AC
  Filled 2024-10-12 – 2024-10-16 (×2): qty 2, 28d supply, fill #0

## 2024-10-12 NOTE — Assessment & Plan Note (Signed)
 Patient was able to start with Wegovy , she is currently administering 0.5 mg dose.  She is doing well with medication, denies any significant side effects.  She will be taking fourth dose of 0.5 mg later this week. Starting weight: 204 pounds Current weight: 200 pounds We discussed considerations, would be reasonable to go up on dose after completing fourth injection of 0.5 mg.  Prescription for 1 mg dose sent to pharmacy.  Advised that if she does well with this, she can send us  a message towards the end of month supply of 1 mg dose and we can send in prescription for 1.7 mg.  Cautioned on potential side effects.  Will plan to follow-up in about 2 months to assess progress

## 2024-10-12 NOTE — Progress Notes (Signed)
 Subjective:    CC: Annual Physical Exam  HPI: Tanya Mccoy is a 48 y.o. presenting for annual physical  I reviewed the past medical history, family history, social history, surgical history, and allergies today and no changes were needed.  Please see the problem list section below in epic for further details.  Past Medical History: Past Medical History:  Diagnosis Date   Allergy 05/2004   Migraine with aura    Muscle strain of left upper arm 06/13/2021   Past Surgical History: Past Surgical History:  Procedure Laterality Date   CESAREAN SECTION  10/2008, 08/2010   Social History: Social History   Socioeconomic History   Marital status: Widowed    Spouse name: Not on file   Number of children: Not on file   Years of education: Not on file   Highest education level: Not on file  Occupational History   Occupation: Runner, Broadcasting/film/video    Comment: [employer]  Tobacco Use   Smoking status: Never    Passive exposure: Never   Smokeless tobacco: Never  Vaping Use   Vaping status: Never Used  Substance and Sexual Activity   Alcohol use: Yes    Alcohol/week: 1.0 standard drink of alcohol    Types: 1 Glasses of wine per week   Drug use: Never   Sexual activity: Not Currently    Birth control/protection: None  Other Topics Concern   Not on file  Social History Narrative   Not on file   Social Drivers of Health   Tobacco Use: Low Risk (10/12/2024)   Patient History    Smoking Tobacco Use: Never    Smokeless Tobacco Use: Never    Passive Exposure: Never  Financial Resource Strain: Low Risk (10/12/2024)   Overall Financial Resource Strain (CARDIA)    Difficulty of Paying Living Expenses: Not hard at all  Food Insecurity: No Food Insecurity (10/12/2024)   Epic    Worried About Radiation Protection Practitioner of Food in the Last Year: Never true    Ran Out of Food in the Last Year: Never true  Transportation Needs: No Transportation Needs (10/12/2024)   Epic    Lack  of Transportation (Medical): No    Lack of Transportation (Non-Medical): No  Physical Activity: Sufficiently Active (10/12/2024)   Exercise Vital Sign    Days of Exercise per Week: 7 days    Minutes of Exercise per Session: 30 min  Stress: No Stress Concern Present (10/12/2024)   [employer] of Occupational Health - Occupational Stress Questionnaire    Feeling of Stress: Not at all  Social Connections: Moderately Integrated (10/12/2024)   Social Connection and Isolation Panel    Frequency of Communication with Friends and Family: More than three times a week    Frequency of Social Gatherings with Friends and Family: More than three times a week    Attends Religious Services: More than 4 times per year    Active Member of Clubs or Organizations: No    Attends Banker Meetings: Never    Marital Status: Married  Depression (PHQ2-9): Low Risk (10/12/2024)   Depression (PHQ2-9)    PHQ-2 Score: 0  Alcohol Screen: Low Risk (10/12/2024)   Alcohol Screen    Last Alcohol Screening Score (AUDIT): 0  Housing: Low Risk (10/12/2024)   Epic    Unable to Pay for Housing in the Last Year: No    Number of Times Moved in the Last Year: 0    Homeless in the Last  Year: No  Utilities: Not At Risk (10/12/2024)   Epic    Threatened with loss of utilities: No  Health Literacy: Adequate Health Literacy (10/12/2024)   B1300 Health Literacy    Frequency of need for help with medical instructions: Never   Family History: Family History  Problem Relation Age of Onset   Diabetes Mother    Healthy Father    Autoimmune disease Sister    Thyroid  disease Sister    Breast cancer Neg Hx    Allergies: Allergies[1] Medications: See med rec.  Review of Systems: No headache, visual changes, nausea, vomiting, diarrhea, constipation, dizziness, abdominal pain, skin rash, fevers, chills, night sweats, swollen lymph nodes, weight loss, chest pain, body aches, joint swelling, muscle aches,  shortness of breath, mood changes, visual or auditory hallucinations.  Objective:    BP 116/62 (BP Location: Right Arm, Patient Position: Sitting, Cuff Size: Large)   Pulse 77   Temp (!) 97.5 F (36.4 C) (Oral)   Resp 18   Ht 5' 3 (1.6 m)   Wt 200 lb (90.7 kg)   LMP 10/07/2024 (Exact Date)   SpO2 100%   BMI 35.43 kg/m   General: Well Developed, well nourished, and in no acute distress. Neuro: Alert and oriented x3, extra-ocular muscles intact, sensation grossly intact. Cranial nerves II through XII are intact, motor, sensory, and coordinative functions are all intact. HEENT: Normocephalic, atraumatic, pupils equal round reactive to light, neck supple, no masses, no lymphadenopathy, thyroid  nonpalpable. Oropharynx, nasopharynx, external ear canals are unremarkable. Skin: Warm and dry, no rashes noted. Cardiac: Regular rate and rhythm, no murmurs rubs or gallops. Respiratory: Clear to auscultation bilaterally. Not using accessory muscles, speaking in full sentences. Abdominal: Soft, nontender, nondistended, positive bowel sounds, no masses, no organomegaly. Musculoskeletal: Shoulder, elbow, wrist, hip, knee, ankle stable, and with full range of motion.  Impression and Recommendations:    Wellness examination Assessment & Plan: Routine HCM labs reviewed. HCM reviewed/discussed. Anticipatory guidance regarding healthy weight, lifestyle and choices given. Recommend healthy diet.  Recommend approximately 150 minutes/week of moderate intensity exercise Recommend regular dental and vision exams Always use seatbelt/lap and shoulder restraints Recommend using smoke alarms and checking batteries at least twice a year Recommend using sunscreen when outside Discussed colon cancer screening recommendations, options.  Patient UTD Discussed immunization recommendations   Vitamin D  insufficiency Assessment & Plan: Noted in past and recent labs continue to show mild vitamin D  deficiency.  She  is not currently taking vitamin D  supplement.  We discussed dosing recommendations.  Would be reasonable to proceed with 2000 international unit dose of vitamin D  which can be obtained over-the-counter.  We can plan to recheck vitamin D  in a few months to assess progress with supplementation   Obesity, Class II, BMI 35-39.9 Assessment & Plan: Patient was able to start with Wegovy , she is currently administering 0.5 mg dose.  She is doing well with medication, denies any significant side effects.  She will be taking fourth dose of 0.5 mg later this week. Starting weight: 204 pounds Current weight: 200 pounds We discussed considerations, would be reasonable to go up on dose after completing fourth injection of 0.5 mg.  Prescription for 1 mg dose sent to pharmacy.  Advised that if she does well with this, she can send us  a message towards the end of month supply of 1 mg dose and we can send in prescription for 1.7 mg.  Cautioned on potential side effects.  Will plan to follow-up in about  2 months to assess progress  Orders: -     Wegovy ; Inject 1 mg into the skin once a week.  Dispense: 2 mL; Refill: 2  Return in about 2 months (around 12/13/2024) for med check.   ___________________________________________ Tanija Germani de [place], MD, ABFM, CAQSM Primary Care and Sports Medicine [facility]    [1] No Known Allergies

## 2024-10-12 NOTE — Assessment & Plan Note (Signed)
 Routine HCM labs reviewed. HCM reviewed/discussed. Anticipatory guidance regarding healthy weight, lifestyle and choices given. Recommend healthy diet.  Recommend approximately 150 minutes/week of moderate intensity exercise Recommend regular dental and vision exams Always use seatbelt/lap and shoulder restraints Recommend using smoke alarms and checking batteries at least twice a year Recommend using sunscreen when outside Discussed colon cancer screening recommendations, options.  Patient UTD Discussed immunization recommendations

## 2024-10-12 NOTE — Assessment & Plan Note (Signed)
 Noted in past and recent labs continue to show mild vitamin D  deficiency.  She is not currently taking vitamin D  supplement.  We discussed dosing recommendations.  Would be reasonable to proceed with 2000 international unit dose of vitamin D  which can be obtained over-the-counter.  We can plan to recheck vitamin D  in a few months to assess progress with supplementation

## 2024-10-16 ENCOUNTER — Other Ambulatory Visit (HOSPITAL_BASED_OUTPATIENT_CLINIC_OR_DEPARTMENT_OTHER): Payer: Self-pay

## 2024-10-20 ENCOUNTER — Other Ambulatory Visit (HOSPITAL_BASED_OUTPATIENT_CLINIC_OR_DEPARTMENT_OTHER): Payer: Self-pay

## 2024-10-20 ENCOUNTER — Telehealth: Admitting: Family Medicine

## 2024-10-20 DIAGNOSIS — L239 Allergic contact dermatitis, unspecified cause: Secondary | ICD-10-CM

## 2024-10-20 MED ORDER — PREDNISONE 20 MG PO TABS
20.0000 mg | ORAL_TABLET | Freq: Two times a day (BID) | ORAL | 0 refills | Status: AC
  Filled 2024-10-20: qty 10, 5d supply, fill #0

## 2024-10-20 NOTE — Progress Notes (Signed)
 E Visit for Rash  We are sorry that you are not feeling well. Here is how we plan to help!  Based on what you shared with me it looks like you have contact dermatitis.  Contact dermatitis is a skin rash caused by something that touches the skin and causes irritation or inflammation.  Your skin may be red, swollen, dry, cracked, and itch.  The rash should go away in a few days but can last a few weeks.  If you get a rash, it's important to figure out what caused it so the irritant can be avoided in the future.  I recommend you take Benadryl 25 mg - 50 mg every 4 hours to control the symptoms but if they last over 24 hours it is best that you see an office based provider for follow up.  Prednisone  10 mg daily for 6 days (see taper instructions below)   HOME CARE:  Take cool showers and avoid direct sunlight. Apply cool compress or wet dressings. Take a bath in an oatmeal bath.  Sprinkle content of one Aveeno packet under running faucet with comfortably warm water.  Bathe for 15-20 minutes, 1-2 times daily.  Pat dry with a towel. Do not rub the rash. Use hydrocortisone  cream. Take an antihistamine like Benadryl for widespread rashes that itch.  The adult dose of Benadryl is 25-50 mg by mouth 4 times daily. Caution:  This type of medication may cause sleepiness.  Do not drink alcohol, drive, or operate dangerous machinery while taking antihistamines.  Do not take these medications if you have prostate enlargement.  Read package instructions thoroughly on all medications that you take.  GET HELP RIGHT AWAY IF:  Symptoms don't go away after treatment. Severe itching that persists. If you rash spreads or swells. If you rash begins to smell. If it blisters and opens or develops a yellow-brown crust. You develop a fever. You have a sore throat. You become short of breath.  MAKE SURE YOU:  Understand these instructions. Will watch your condition. Will get help right away if you are not doing  well or get worse.  Thank you for choosing an e-visit. Your e-visit answers were reviewed by a board certified advanced clinical practitioner to complete your personal care plan. Depending upon the condition, your plan could have included both over the counter or prescription medications. Please review your pharmacy choice. Be sure that the pharmacy you have chosen is open so that you can pick up your prescription now.  If there is a problem you may message your provider in MyChart to have the prescription routed to another pharmacy. Your safety is important to us . If you have drug allergies check your prescription carefully.  For the next 24 hours, you can use MyChart to ask questions about todays visit, request a non-urgent call back, or ask for a work or school excuse from your e-visit provider. You will get an email in the next two days asking about your experience. I hope that your e-visit has been valuable and will speed your recovery.  I have spent 5 minutes in review of e-visit questionnaire, review and updating patient chart, medical decision making and response to patient.   Wilma Wuthrich, FNP

## 2024-10-21 ENCOUNTER — Telehealth: Admitting: Nurse Practitioner

## 2024-10-21 DIAGNOSIS — B359 Dermatophytosis, unspecified: Secondary | ICD-10-CM | POA: Diagnosis not present

## 2024-10-21 MED ORDER — CLOTRIMAZOLE-BETAMETHASONE 1-0.05 % EX CREA: 1.0000 | TOPICAL_CREAM | Freq: Every day | CUTANEOUS | 0 refills | Status: AC

## 2024-10-21 MED ORDER — FLUCONAZOLE 150 MG PO TABS: 150.0000 mg | ORAL_TABLET | ORAL | 0 refills | Status: AC

## 2024-10-21 MED ORDER — FLUCONAZOLE 150 MG PO TABS: 150.0000 mg | ORAL_TABLET | Freq: Once | ORAL | 0 refills | Status: DC

## 2024-10-21 NOTE — Patient Instructions (Signed)
 " Tanya Mccoy, thank you for joining Haze LELON Servant, NP for today's virtual visit.  While this provider is not your primary care provider (PCP), if your PCP is located in our provider database this encounter information will be shared with them immediately following your visit.   A [facility] MyChart account gives you access to today's visit and all your visits, tests, and labs performed at [facility]  click here if you don't have a [facility] MyChart account or go to [email or web address]  Consent: (Patient) Tanya Mccoy provided verbal consent for this virtual visit at the beginning of the encounter.  Current Medications:  Current Outpatient Medications:    clotrimazole -betamethasone  (LOTRISONE ) cream, Apply 1 Application topically daily., Disp: 30 g, Rfl: 0   BIOTIN PO, Take by mouth., Disp: , Rfl:    cetirizine  (ZYRTEC  ALLERGY) 10 MG tablet, Take 1 tablet (10 mg total) by mouth daily., Disp: 30 tablet, Rfl: 0   fluconazole  (DIFLUCAN ) 150 MG tablet, Take 1 tablet (150 mg total) by mouth once a week., Disp: 4 tablet, Rfl: 0   fluticasone  (FLONASE ) 50 MCG/ACT nasal spray, Place 2 sprays into both nostrils daily., Disp: 16 g, Rfl: 6   methylPREDNISolone  (MEDROL  DOSEPAK) 4 MG TBPK tablet, Take as directed, Disp: 21 tablet, Rfl: 0   predniSONE  (DELTASONE ) 20 MG tablet, Take 1 tablet (20 mg total) by mouth 2 (two) times daily with a meal for 5 days., Disp: 10 tablet, Rfl: 0   Probiotic Product (PROBIOTIC DAILY PO), Take by mouth., Disp: , Rfl:    semaglutide -weight management (WEGOVY ) 1 MG/0.5ML SOAJ SQ injection, Inject 1 mg into the skin once a week., Disp: 2 mL, Rfl: 2   Medications ordered in this encounter:  Meds ordered this encounter  Medications   DISCONTD: fluconazole  (DIFLUCAN ) 150 MG tablet    Sig: Take 1 tablet (150 mg total) by mouth once for 1 dose.    Dispense:  1 tablet    Refill:  0    Supervising Provider:   LAMPTEY, PHILIP O  [id number]   clotrimazole -betamethasone  (LOTRISONE ) cream    Sig: Apply 1 Application topically daily.    Dispense:  30 g    Refill:  0    Supervising Provider:   BLAISE ALEENE KIDD [[id number]]   fluconazole  (DIFLUCAN ) 150 MG tablet    Sig: Take 1 tablet (150 mg total) by mouth once a week.    Dispense:  4 tablet    Refill:  0    Supervising Provider:   LAMPTEY, PHILIP O [[id number]]     *If you need refills on other medications prior to your next appointment, please contact your pharmacy*  Follow-Up: Call back or seek an in-person evaluation if the symptoms worsen or if the condition fails to improve as anticipated.  [facility] [phone]  Other Instructions Follow up with PCP for dermatology referral if no improvement   If you have been instructed to have an in-person evaluation today at a local Urgent Care facility, please use the link below. It will take you to a list of all of our available [facility], including address, phone number and hours of operation. Please do not delay care.  [facility]  If you or a family member do not have a primary care provider, use the link below to schedule a visit and establish care. When you choose a [facility] primary care physician or advanced practice provider, you gain a long-term  partner in health. Find a Primary Care Provider  Learn more about [facility] in-office and virtual care options: [facility] Get Care Now  "

## 2024-10-21 NOTE — Progress Notes (Signed)
 " Virtual Visit Consent   ROREY HODGES, you are scheduled for a virtual visit with a [facility] provider today. Just as with appointments in the office, your consent must be obtained to participate. Your consent will be active for this visit and any virtual visit you may have with one of our providers in the next 365 days. If you have a MyChart account, a copy of this consent can be sent to you electronically.  As this is a virtual visit, video technology does not allow for your provider to perform a traditional examination. This may limit your provider's ability to fully assess your condition. If your provider identifies any concerns that need to be evaluated in person or the need to arrange testing (such as labs, EKG, etc.), we will make arrangements to do so. Although advances in technology are sophisticated, we cannot ensure that it will always work on either your end or our end. If the connection with a video visit is poor, the visit may have to be switched to a telephone visit. With either a video or telephone visit, we are not always able to ensure that we have a secure connection.  By engaging in this virtual visit, you consent to the provision of healthcare and authorize for your insurance to be billed (if applicable) for the services provided during this visit. Depending on your insurance coverage, you may receive a charge related to this service.  I need to obtain your verbal consent now. Are you willing to proceed with your visit today? Shaton M Perrault-Manuel has provided verbal consent on 10/21/2024 for a virtual visit (video or telephone). Haze LELON Servant, NP  Date: 10/21/2024 9:30 AM   Virtual Visit via Video Note   I, Haze LELON Servant, connected with  MONETTA LICK  ([id number], Mar 09, 1976) on 10/21/2024 at  9:15 AM EST by a video-enabled telemedicine application and verified that I am speaking with the correct person using two  identifiers.  Location: Patient: Virtual Visit Location Patient: Home Provider: Virtual Visit Location Provider: Home Office   I discussed the limitations of evaluation and management by telemedicine and the availability of in person appointments. The patient expressed understanding and agreed to proceed.    History of Present Illness: Tanya Mccoy is a 48 y.o. who identifies as a female who was assigned female at birth, and is being seen today for rash.  Ms Cuadra has been experiencing a generalized rash on her body for the past 2 weeks after cutting back a fallen dead tree in her yard. Despite being prescribed steroids the rash has persisted specifically between her breasts and in the folds of her arms. She was prescribed another dose of steroids yesterday but was not able to pick up due to traveling out of town.   Problems:  Patient Active Problem List   Diagnosis Date Noted   SI (sacroiliac) joint dysfunction 09/14/2024   Menorrhagia with regular cycle 08/24/2024   Obesity, Class II, BMI 35-39.9 08/04/2024   Vitamin D  insufficiency 10/12/2023   Hyperlipidemia 10/12/2023   Encounter to establish care 04/07/2023   Family history of diabetes mellitus in mother 04/07/2023   Wellness examination 04/07/2023   Dysmenorrhea 04/18/2022   Weight gain 06/13/2021   Seasonal allergies 11/21/2019    Allergies: Allergies[1] Medications: Current Medications[2]  Observations/Objective: Patient is well-developed, well-nourished in no acute distress.  Resting comfortably at home.  Head is normocephalic, atraumatic.  No labored breathing.  Speech is clear and coherent with logical content.  Patient is alert and oriented at baseline.    Assessment and Plan: 1. Tinea (Primary) - fluconazole  (DIFLUCAN ) 150 MG tablet; Take 1 tablet (150 mg total) by mouth once for 1 dose.  Dispense: 1 tablet; Refill: 0 - clotrimazole -betamethasone  (LOTRISONE ) cream; Apply 1 Application  topically daily.  Dispense: 30 g; Refill: 0  Follow up with PCP for dermatology referral if no improvement  Follow Up Instructions: I discussed the assessment and treatment plan with the patient. The patient was provided an opportunity to ask questions and all were answered. The patient agreed with the plan and demonstrated an understanding of the instructions.  A copy of instructions were sent to the patient via MyChart unless otherwise noted below.    The patient was advised to call back or seek an in-person evaluation if the symptoms worsen or if the condition fails to improve as anticipated.    Haze LELON Servant, NP     [1] No Known Allergies [2]  Current Outpatient Medications:    clotrimazole -betamethasone  (LOTRISONE ) cream, Apply 1 Application topically daily., Disp: 30 g, Rfl: 0   fluconazole  (DIFLUCAN ) 150 MG tablet, Take 1 tablet (150 mg total) by mouth once for 1 dose., Disp: 1 tablet, Rfl: 0   BIOTIN PO, Take by mouth., Disp: , Rfl:    cetirizine  (ZYRTEC  ALLERGY) 10 MG tablet, Take 1 tablet (10 mg total) by mouth daily., Disp: 30 tablet, Rfl: 0   fluticasone  (FLONASE ) 50 MCG/ACT nasal spray, Place 2 sprays into both nostrils daily., Disp: 16 g, Rfl: 6   methylPREDNISolone  (MEDROL  DOSEPAK) 4 MG TBPK tablet, Take as directed, Disp: 21 tablet, Rfl: 0   predniSONE  (DELTASONE ) 20 MG tablet, Take 1 tablet (20 mg total) by mouth 2 (two) times daily with a meal for 5 days., Disp: 10 tablet, Rfl: 0   Probiotic Product (PROBIOTIC DAILY PO), Take by mouth., Disp: , Rfl:    semaglutide -weight management (WEGOVY ) 1 MG/0.5ML SOAJ SQ injection, Inject 1 mg into the skin once a week., Disp: 2 mL, Rfl: 2  "

## 2024-10-30 ENCOUNTER — Other Ambulatory Visit (HOSPITAL_BASED_OUTPATIENT_CLINIC_OR_DEPARTMENT_OTHER): Payer: Self-pay

## 2024-12-13 ENCOUNTER — Ambulatory Visit (HOSPITAL_BASED_OUTPATIENT_CLINIC_OR_DEPARTMENT_OTHER): Admitting: Family Medicine

## 2024-12-15 ENCOUNTER — Ambulatory Visit: Admitting: Family Medicine
